# Patient Record
Sex: Male | Born: 1995 | Race: White | Hispanic: No | Marital: Single | Smoking: Never smoker
Health system: Southern US, Community
[De-identification: ages and names within clinical notes are randomized; demographics above are authoritative.]

## PROBLEM LIST (undated history)

## (undated) DIAGNOSIS — J45909 Unspecified asthma, uncomplicated: Secondary | ICD-10-CM

## (undated) DIAGNOSIS — F419 Anxiety disorder, unspecified: Secondary | ICD-10-CM

## (undated) HISTORY — PX: TYMPANOSTOMY TUBE PLACEMENT: SHX32

---
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
## 2016-09-21 ENCOUNTER — Emergency Department (HOSPITAL_BASED_OUTPATIENT_CLINIC_OR_DEPARTMENT_OTHER)
Admission: EM | Admit: 2016-09-21 | Discharge: 2016-09-21 | Disposition: A | Discharge status: Home / Self Care | Payer: BLUE CROSS/BLUE SHIELD | Attending: Emergency Medicine | Admitting: Emergency Medicine

## 2016-09-21 ENCOUNTER — Encounter (HOSPITAL_BASED_OUTPATIENT_CLINIC_OR_DEPARTMENT_OTHER): Payer: Self-pay | Admitting: *Deleted

## 2016-09-21 DIAGNOSIS — J45909 Unspecified asthma, uncomplicated: Secondary | ICD-10-CM | POA: Diagnosis not present

## 2016-09-21 DIAGNOSIS — R0989 Other specified symptoms and signs involving the circulatory and respiratory systems: Secondary | ICD-10-CM | POA: Diagnosis not present

## 2016-09-21 HISTORY — DX: Unspecified asthma, uncomplicated: J45.909

## 2016-09-21 NOTE — ED Triage Notes (Signed)
Pt Brought in by EMS from home , pt states took Vit c  Tab at 2200 and now has throat irritation

## 2016-09-21 NOTE — Discharge Instructions (Signed)
Return if you have difficulty breathing or are unable to swallow.

## 2016-09-21 NOTE — ED Provider Notes (Signed)
   [facility] Provider Note: Roberto DellJ. Lane Forrest Jaroszewski, MD, FACEP  CSN: [id number] MRN: [id number] ARRIVAL: 09/21/16 at 0041 ROOM: [facility]02/[facility]02   CHIEF COMPLAINT  Throat Irritation   HISTORY OF PRESENT ILLNESS  Roberto Clark is a 21 y.o. male who swallowed a vitamin C tablet yesterday evening about 10 PM. Since then he has had a foreign body sensation in his throat. He describes it as more of an irritation rather than frank pain. He has tried drinking water and honey without relief. The sensation is worse when he swallows. He is able to swallow. He is not having any difficulty breathing. He admits to history of anxiety and states that the symptoms are causing him to be anxious.   Past Medical History:  Diagnosis Date  . Asthma     Past Surgical History:  Procedure Laterality Date  . TYMPANOSTOMY TUBE PLACEMENT      History reviewed. No pertinent family history.  Social History  Substance Use Topics  . Smoking status: Never Smoker  . Smokeless tobacco: Never Used  . Alcohol use No    Prior to Admission medications   Not on File    Allergies Patient has no known allergies.   REVIEW OF SYSTEMS  Negative except as noted here or in the History of Present Illness.   PHYSICAL EXAMINATION  Initial Vital Signs Blood pressure (!) 174/93, pulse 98, temperature 98.9 F (37.2 C), temperature source Oral, resp. rate 19, height 5\' 6"  (1.676 m), weight 54 kg (119 lb), SpO2 100 %.  Examination General: Well-developed, well-nourished male in no acute distress; appearance consistent with age of record HENT: normocephalic; atraumatic; no foreign body or injury seen in oropharynx; uvula midline; no dysphonia; no stridor Eyes: pupils equal, round and reactive to light; extraocular muscles intact Neck: supple Heart: regular rate and rhythm Lungs: clear to auscultation bilaterally Abdomen: soft; nondistended; nontender; bowel sounds present Extremities: No deformity; full range of  motion; pulses normal Neurologic: Awake, alert and oriented; motor function intact in all extremities and symmetric; no facial droop Skin: Warm and dry Psychiatric: Anxious   RESULTS  Summary of this visit's results, reviewed by myself:   EKG Interpretation  Date/Time:    Ventricular Rate:    PR Interval:    QRS Duration:   QT Interval:    QTC Calculation:   R Axis:     Text Interpretation:        Laboratory Studies: No results found for this or any previous visit (from the past 24 hour(s)). Imaging Studies: No results found.  ED COURSE  Nursing notes and initial vitals signs, including pulse oximetry, reviewed.  Vitals:   09/21/16 0045 09/21/16 0048  BP:  (!) 174/93  Pulse:  98  Resp:  19  Temp:  98.9 F (37.2 C)  TempSrc:  Oral  SpO2:  100%  Weight: 54 kg (119 lb)   Height: 5\' 6"  (1.676 m)    As patient is able to swallow without difficulty I doubt the presence of a retained foreign body. Most likely he abraded his pharynx on swallowing the tablet. He was advised that Chloraseptic spray may help with his symptoms.  PROCEDURES    ED DIAGNOSES     ICD-10-CM   1. Foreign body sensation in throat R09.89        Paula LibraMolpus, Autumnrose Yore, MD 09/21/16 [phone]

## 2020-05-08 ENCOUNTER — Emergency Department
Admission: EM | Admit: 2020-05-08 | Discharge: 2020-05-08 | Disposition: A | Discharge status: Home / Self Care | Payer: BC Managed Care – PPO | Attending: Emergency Medicine | Admitting: Emergency Medicine

## 2020-05-08 DIAGNOSIS — R202 Paresthesia of skin: Secondary | ICD-10-CM | POA: Insufficient documentation

## 2020-05-08 DIAGNOSIS — J45909 Unspecified asthma, uncomplicated: Secondary | ICD-10-CM | POA: Diagnosis not present

## 2020-05-08 DIAGNOSIS — R0602 Shortness of breath: Secondary | ICD-10-CM

## 2020-05-08 DIAGNOSIS — F419 Anxiety disorder, unspecified: Secondary | ICD-10-CM | POA: Diagnosis not present

## 2020-05-08 MED ORDER — PROPRANOLOL HCL 20 MG PO TABS: 20.0000 mg | ORAL_TABLET | Freq: Three times a day (TID) | ORAL | 0 refills | Status: DC

## 2020-05-08 NOTE — ED Triage Notes (Signed)
Per ems sob, anxiety attack, heart rate 150's

## 2020-05-08 NOTE — ED Provider Notes (Signed)
[facility] Emergency Department Provider Note  ____________________________________________  Time seen: Approximately 3:49 PM  I have reviewed the triage vital signs and the nursing notes.   HISTORY  Chief Complaint Panic Attack (Per ems, panick attack, hr 150's, sob )    HPI Roberto Clark is a 25 y.o. male with a past history of asthma who comes the ED due to shortness of breath that started today after lunchtime.   He was in his usual state of health, ate lunch normally, and while walking back to work started getting more more short of breath.  This was associated with feeling of heart racing, bilateral hands tingling and feeling numb, bilateral legs feeling numb and tingly, face numbness.  No syncope.  No significant chest pain.  No preceding prodromal symptoms.  Symptoms are intermittent, currently resolved again.  Reports this is happened 3 times last week as well.  He has been feeling stressed and overwhelmed at work lately.  No recent travel trauma hospitalization or surgery.  No history of DVT or PE.  Also reports recently having recurrent left-sided nosebleed, does admit that he sometimes picks his nose due to discomfort.     Past Medical History:  Diagnosis Date  . Asthma      There are no problems to display for this patient.    Past Surgical History:  Procedure Laterality Date  . TYMPANOSTOMY TUBE PLACEMENT       Prior to Admission medications   Medication Sig Start Date End Date Taking? Authorizing Provider  propranolol (INDERAL) 20 MG tablet Take 1 tablet (20 mg total) by mouth 3 (three) times daily. 05/08/20  Yes Sharman Cheek, MD     Allergies Patient has no known allergies.   History reviewed. No pertinent family history.  Social History Social History   Tobacco Use  . Smoking status: Never Smoker  . Smokeless tobacco: Never Used  Substance Use Topics  . Alcohol use: No  . Drug use: No    Review of  Systems  Constitutional:   No fever or chills.  ENT:   No sore throat. No rhinorrhea. Cardiovascular:   No chest pain or syncope. Respiratory: Positive shortness of breath without cough. Gastrointestinal:   Negative for abdominal pain, vomiting and diarrhea.  Musculoskeletal:   Negative for focal pain or swelling All other systems reviewed and are negative except as documented above in ROS and HPI.  ____________________________________________   PHYSICAL EXAM:  VITAL SIGNS: ED Triage Vitals  Enc Vitals Group     BP 05/08/20 1457 (!) 153/89     Pulse Rate 05/08/20 1457 (!) 105     Resp 05/08/20 1457 (!) 22     Temp 05/08/20 1457 98.2 F (36.8 C)     Temp Source 05/08/20 1457 Oral     SpO2 05/08/20 1457 99 %     Weight 05/08/20 1458 154 lb 5.2 oz (70 kg)     Height 05/08/20 1458 5\' 8"  (1.727 m)     Head Circumference --      Peak Flow --      Pain Score 05/08/20 1458 0     Pain Loc --      Pain Edu? --      Excl. in GC? --     Vital signs reviewed, nursing assessments reviewed.   Constitutional:   Alert and oriented. Non-toxic appearance. Eyes:   Conjunctivae are normal. EOMI. PERRL. ENT      Head:   Normocephalic and  atraumatic.      Nose:   No epistaxis.  Small area of abrasion on the left anterior septum from recent bleeding, currently hemostatic.      Mouth/Throat: Normal.  Moist mucosa     Neck:   No meningismus. Full ROM. Hematological/Lymphatic/Immunilogical:   No cervical lymphadenopathy. Cardiovascular:   RRR. Symmetric bilateral radial and DP pulses.  No murmurs. Cap refill less than 2 seconds. Respiratory:   Normal respiratory effort without tachypnea/retractions. Breath sounds are clear and equal bilaterally. No wheezes/rales/rhonchi. Gastrointestinal:   Soft and nontender. Non distended. There is no CVA tenderness.  No rebound, rigidity, or guarding. Musculoskeletal:   Normal range of motion in all extremities. No joint effusions.  No lower extremity  tenderness.  No edema. Neurologic:   Normal speech and language.  Motor grossly intact. No acute focal neurologic deficits are appreciated.  Skin:    Skin is warm, dry and intact. No rash noted.  No petechiae, purpura, or bullae.  ____________________________________________    LABS (pertinent positives/negatives) (all labs ordered are listed, but only abnormal results are displayed) Labs Reviewed - No data to display ____________________________________________   EKG  EKG performed by EMS interpreted by me Normal sinus rhythm rate of 95, normal axis and intervals, normal QRS ST segments and T waves.  No evidence of underlying dysrhythmia or right heart strain.  ____________________________________________    RADIOLOGY  No results found.  ____________________________________________   PROCEDURES Procedures  ____________________________________________    CLINICAL IMPRESSION / ASSESSMENT AND PLAN / ED COURSE  Medications ordered in the ED: Medications - No data to display  Pertinent labs & imaging results that were available during my care of the patient were reviewed by me and considered in my medical decision making (see chart for details).  Roberto Clark was evaluated in Emergency Department on 05/08/2020 for the symptoms described in the history of present illness. He was evaluated in the context of the global COVID-19 pandemic, which necessitated consideration that the patient might be at risk for infection with the SARS-CoV-2 virus that causes COVID-19. Institutional protocols and algorithms that pertain to the evaluation of patients at risk for COVID-19 are in a state of rapid change based on information released by regulatory bodies including the CDC and federal and state organizations. These policies and algorithms were followed during the patient's care in the ED.   Patient presents with multiple intermittent episodes of shortness of breath and paresthesias  consistent with anxiety/panic attack.  Initial vital signs were somewhat tachycardic and tachypneic, but now symptoms have resolved and vital signs have normalized. Considering the patient's symptoms, medical history, and physical examination today, I have low suspicion for ACS, PE, TAD, pneumothorax, carditis, mediastinitis, pneumonia, CHF, or sepsis.  Doubt hyperthyroidism or other endocrine disturbance.  No evidence of underlying cardiac dysrhythmia.  I think PE is highly unlikely given normal EMS EKG and intermittent symptoms and normalized vital signs without any treatment.  We will do trial of propranolol, recommend obtaining primary care for follow-up.      ____________________________________________   FINAL CLINICAL IMPRESSION(S) / ED DIAGNOSES    Final diagnoses:  SOB (shortness of breath)  Anxiety     ED Discharge Orders         Ordered    propranolol (INDERAL) 20 MG tablet  3 times daily        05/08/20 1546          Portions of this note were generated with dragon dictation software. Dictation  errors may occur despite best attempts at proofreading.   Sharman Cheek, MD 05/08/20 [phone]

## 2020-05-15 ENCOUNTER — Ambulatory Visit
Admission: EM | Admit: 2020-05-15 | Discharge: 2020-05-15 | Disposition: A | Discharge status: Home / Self Care | Payer: BC Managed Care – PPO | Attending: Emergency Medicine | Admitting: Emergency Medicine

## 2020-05-15 ENCOUNTER — Other Ambulatory Visit: Payer: Self-pay

## 2020-05-15 ENCOUNTER — Encounter: Payer: Self-pay | Admitting: Emergency Medicine

## 2020-05-15 DIAGNOSIS — F419 Anxiety disorder, unspecified: Secondary | ICD-10-CM | POA: Diagnosis not present

## 2020-05-15 DIAGNOSIS — Z760 Encounter for issue of repeat prescription: Secondary | ICD-10-CM

## 2020-05-15 HISTORY — DX: Anxiety disorder, unspecified: F41.9

## 2020-05-15 MED ORDER — PROPRANOLOL HCL 20 MG PO TABS: 20.0000 mg | ORAL_TABLET | Freq: Three times a day (TID) | ORAL | 2 refills | Status: DC

## 2020-05-15 NOTE — Discharge Instructions (Addendum)
Continue to take your propranolol 3 times a day as originally prescribed for relief of your anxiety symptoms and panic attack.  Keep your appointment at the end of April to establish care with your primary care provider so that they may manage your medication going forward.  Ensure that you are keeping up with your oral fluid intake so that you do not become dehydrated to minimize your chance of becoming dizzy or passing out as a result of low blood pressure from taking this medication.

## 2020-05-15 NOTE — ED Provider Notes (Signed)
[facility]    CSN: [id number] Arrival date & time: 05/15/20  1101      History   Chief Complaint Chief Complaint  Patient presents with  . Medication Refill    HPI Roberto Clark is a 25 y.o. male.   HPI   25 year old male here for prescription refill of propranolol.  Patient was evaluated in the emergency department at [facility] on 05/08/2020 for shortness of breath and chest pain.  It was determined that this is most likely due to anxiety and panic attack as his evaluation was negative.  Patient was started on propranolol 20 mg 3 times a day which patient reports has been helping him.  Patient was only given a 7-day supply and no refills from the ER.  Patient has an appointment to establish care with a primary care provider but it is not until July 10, 2020.  Patient denies chest pain, dizziness, or syncope.  Past Medical History:  Diagnosis Date  . Anxiety   . Asthma     There are no problems to display for this patient.   Past Surgical History:  Procedure Laterality Date  . TYMPANOSTOMY TUBE PLACEMENT         Home Medications    Prior to Admission medications   Medication Sig Start Date End Date Taking? Authorizing Provider  propranolol (INDERAL) 20 MG tablet Take 1 tablet (20 mg total) by mouth 3 (three) times daily. 05/15/20 06/14/20  Becky Augusta, NP    Family History History reviewed. No pertinent family history.  Social History Social History   Tobacco Use  . Smoking status: Never Smoker  . Smokeless tobacco: Never Used  Vaping Use  . Vaping Use: Former  Substance Use Topics  . Alcohol use: No  . Drug use: No     Allergies   Patient has no known allergies.   Review of Systems Review of Systems  Cardiovascular: Negative for chest pain.  Neurological: Negative for dizziness and syncope.  Psychiatric/Behavioral: The patient is nervous/anxious.      Physical Exam Triage Vital Signs ED Triage Vitals  Enc Vitals Group      BP 05/15/20 1110 (!) 139/95     Pulse Rate 05/15/20 1110 (!) 55     Resp 05/15/20 1110 16     Temp 05/15/20 1110 97.7 F (36.5 C)     Temp Source 05/15/20 1110 Oral     SpO2 05/15/20 1110 100 %     Weight 05/15/20 1108 120 lb (54.4 kg)     Height 05/15/20 1108 5\' 3"  (1.6 m)     Head Circumference --      Peak Flow --      Pain Score 05/15/20 1108 0     Pain Loc --      Pain Edu? --      Excl. in GC? --    No data found.  Updated Vital Signs BP (!) 139/95 (BP Location: Left Arm)   Pulse (!) 55   Temp 97.7 F (36.5 C) (Oral)   Resp 16   Ht 5\' 3"  (1.6 m)   Wt 120 lb (54.4 kg)   SpO2 100%   BMI 21.26 kg/m   Visual Acuity Right Eye Distance:   Left Eye Distance:   Bilateral Distance:    Right Eye Near:   Left Eye Near:    Bilateral Near:     Physical Exam Vitals and nursing note reviewed.  Constitutional:  General: He is not in acute distress.    Appearance: Normal appearance. He is normal weight. He is not ill-appearing.  HENT:     Head: Normocephalic and atraumatic.  Cardiovascular:     Rate and Rhythm: Normal rate and regular rhythm.     Pulses: Normal pulses.     Heart sounds: Normal heart sounds. No murmur heard. No gallop.   Pulmonary:     Effort: Pulmonary effort is normal.     Breath sounds: Normal breath sounds. No wheezing, rhonchi or rales.  Skin:    General: Skin is warm and dry.     Capillary Refill: Capillary refill takes less than 2 seconds.     Findings: No erythema or rash.  Neurological:     General: No focal deficit present.     Mental Status: He is alert and oriented to person, place, and time.     Sensory: No sensory deficit.  Psychiatric:        Mood and Affect: Mood normal.        Behavior: Behavior normal.        Thought Content: Thought content normal.        Judgment: Judgment normal.      UC Treatments / Results  Labs (all labs ordered are listed, but only abnormal results are displayed) Labs Reviewed - No data  to display  EKG   Radiology No results found.  Procedures Procedures (including critical care time)  Medications Ordered in UC Medications - No data to display  Initial Impression / Assessment and Plan / UC Course  I have reviewed the triage vital signs and the nursing notes.  Pertinent labs & imaging results that were available during my care of the patient were reviewed by me and considered in my medical decision making (see chart for details).   Patient is a very pleasant 25 year old male here for a refill of his propranolol that he is taking for his anxiety and panic attacks.  Patient has been taking the medication as directed, 3 times a day, and reports that his symptoms have greatly improved.  Patient was only given a 7-day supply from the emergency department and does not have an appointment to see a primary care provider to establish care until the end of April.  Patient is tolerating the medication well and has not had any dizziness, chest pain, or syncope.  Physical exam is benign.  Will give patient a 30-day supply and 2 refill's to bridge him until his appointment.   Final Clinical Impressions(s) / UC Diagnoses   Final diagnoses:  Medication refill  Anxiety     Discharge Instructions     Continue to take your propranolol 3 times a day as originally prescribed for relief of your anxiety symptoms and panic attack.  Keep your appointment at the end of April to establish care with your primary care provider so that they may manage your medication going forward.  Ensure that you are keeping up with your oral fluid intake so that you do not become dehydrated to minimize your chance of becoming dizzy or passing out as a result of low blood pressure from taking this medication.    ED Prescriptions    Medication Sig Dispense Auth. Provider   propranolol (INDERAL) 20 MG tablet  (Status: Discontinued) Take 1 tablet (20 mg total) by mouth 3 (three) times daily. 90 tablet  Becky Augusta, NP   propranolol (INDERAL) 20 MG tablet Take 1 tablet (20 mg total) by  mouth 3 (three) times daily. 90 tablet Becky Augusta, NP     PDMP not reviewed this encounter.   Becky Augusta, NP 05/15/20 1125

## 2020-05-15 NOTE — ED Triage Notes (Signed)
Patient here to get a refill on his Propranolol for panic attack and anxiety.  Patient states that his symptoms have improved with the medicine. Patient states that his new visit appointment is not until April.

## 2020-07-10 DIAGNOSIS — Z8709 Personal history of other diseases of the respiratory system: Secondary | ICD-10-CM | POA: Insufficient documentation

## 2020-07-10 DIAGNOSIS — F32A Depression, unspecified: Secondary | ICD-10-CM | POA: Insufficient documentation

## 2020-07-10 DIAGNOSIS — F419 Anxiety disorder, unspecified: Secondary | ICD-10-CM | POA: Insufficient documentation

## 2021-07-07 DIAGNOSIS — R778 Other specified abnormalities of plasma proteins: Secondary | ICD-10-CM | POA: Diagnosis not present

## 2021-07-07 DIAGNOSIS — J45909 Unspecified asthma, uncomplicated: Secondary | ICD-10-CM | POA: Insufficient documentation

## 2021-07-07 DIAGNOSIS — R0789 Other chest pain: Principal | ICD-10-CM | POA: Insufficient documentation

## 2021-07-07 DIAGNOSIS — R0603 Acute respiratory distress: Secondary | ICD-10-CM | POA: Diagnosis not present

## 2021-07-07 DIAGNOSIS — R0602 Shortness of breath: Secondary | ICD-10-CM | POA: Diagnosis present

## 2021-07-08 ENCOUNTER — Observation Stay
Admit: 2021-07-08 | Discharge: 2021-07-08 | Disposition: A | Discharge status: Home / Self Care | Payer: No Typology Code available for payment source | Attending: Internal Medicine | Admitting: Internal Medicine

## 2021-07-08 ENCOUNTER — Emergency Department: Payer: No Typology Code available for payment source

## 2021-07-08 ENCOUNTER — Observation Stay
Admission: EM | Admit: 2021-07-08 | Discharge: 2021-07-09 | Disposition: A | Discharge status: Home / Self Care | Payer: No Typology Code available for payment source | Attending: Obstetrics and Gynecology | Admitting: Obstetrics and Gynecology

## 2021-07-08 ENCOUNTER — Encounter: Payer: Self-pay | Admitting: Emergency Medicine

## 2021-07-08 ENCOUNTER — Other Ambulatory Visit: Payer: Self-pay

## 2021-07-08 DIAGNOSIS — R778 Other specified abnormalities of plasma proteins: Secondary | ICD-10-CM

## 2021-07-08 DIAGNOSIS — R030 Elevated blood-pressure reading, without diagnosis of hypertension: Secondary | ICD-10-CM

## 2021-07-08 DIAGNOSIS — F411 Generalized anxiety disorder: Secondary | ICD-10-CM

## 2021-07-08 DIAGNOSIS — R079 Chest pain, unspecified: Secondary | ICD-10-CM | POA: Diagnosis not present

## 2021-07-08 DIAGNOSIS — F410 Panic disorder [episodic paroxysmal anxiety] without agoraphobia: Secondary | ICD-10-CM

## 2021-07-08 DIAGNOSIS — R7989 Other specified abnormal findings of blood chemistry: Secondary | ICD-10-CM

## 2021-07-08 DIAGNOSIS — R0603 Acute respiratory distress: Secondary | ICD-10-CM

## 2021-07-08 DIAGNOSIS — R0789 Other chest pain: Secondary | ICD-10-CM

## 2021-07-08 DIAGNOSIS — R200 Anesthesia of skin: Secondary | ICD-10-CM

## 2021-07-08 LAB — ECHOCARDIOGRAM COMPLETE
AR max vel: 1.8 cm2
AV Area VTI: 1.83 cm2
AV Area mean vel: 1.87 cm2
AV Mean grad: 3 mmHg
AV Peak grad: 6.9 mmHg
Ao pk vel: 1.31 m/s
Area-P 1/2: 4.99 cm2
Height: 63 in
MV VTI: 3.08 cm2
S' Lateral: 3 cm
Weight: 1920 oz

## 2021-07-08 LAB — CBC
HCT: 46.2 % (ref 39.0–52.0)
Hemoglobin: 15.4 g/dL (ref 13.0–17.0)
MCH: 27.8 pg (ref 26.0–34.0)
MCHC: 33.3 g/dL (ref 30.0–36.0)
MCV: 83.4 fL (ref 80.0–100.0)
Platelets: 348 10*3/uL (ref 150–400)
RBC: 5.54 MIL/uL (ref 4.22–5.81)
RDW: 12.6 % (ref 11.5–15.5)
WBC: 9.5 10*3/uL (ref 4.0–10.5)
nRBC: 0 % (ref 0.0–0.2)

## 2021-07-08 LAB — VITAMIN B12: Vitamin B-12: 286 pg/mL (ref 180–914)

## 2021-07-08 LAB — URINE DRUG SCREEN, QUALITATIVE (ARMC ONLY)
Amphetamines, Ur Screen: NOT DETECTED
Barbiturates, Ur Screen: NOT DETECTED
Benzodiazepine, Ur Scrn: NOT DETECTED
Cannabinoid 50 Ng, Ur ~~LOC~~: NOT DETECTED
Cocaine Metabolite,Ur ~~LOC~~: NOT DETECTED
MDMA (Ecstasy)Ur Screen: NOT DETECTED
Methadone Scn, Ur: NOT DETECTED
Opiate, Ur Screen: NOT DETECTED
Phencyclidine (PCP) Ur S: NOT DETECTED
Tricyclic, Ur Screen: NOT DETECTED

## 2021-07-08 LAB — BASIC METABOLIC PANEL
Anion gap: 10 (ref 5–15)
BUN: 12 mg/dL (ref 6–20)
CO2: 26 mmol/L (ref 22–32)
Calcium: 9.5 mg/dL (ref 8.9–10.3)
Chloride: 100 mmol/L (ref 98–111)
Creatinine, Ser: 0.96 mg/dL (ref 0.61–1.24)
GFR, Estimated: >60 mL/min (ref >60)
Glucose, Bld: 99 mg/dL (ref 70–99)
Potassium: 3.5 mmol/L (ref 3.5–5.1)
Sodium: 136 mmol/L (ref 135–145)

## 2021-07-08 LAB — TROPONIN I (HIGH SENSITIVITY)
Troponin I (High Sensitivity): 16 ng/L (ref <18)
Troponin I (High Sensitivity): 22 ng/L — ABNORMAL HIGH (ref <18)
Troponin I (High Sensitivity): 49 ng/L — ABNORMAL HIGH (ref <18)
Troponin I (High Sensitivity): 93 ng/L — ABNORMAL HIGH (ref <18)

## 2021-07-08 LAB — HIV ANTIBODY (ROUTINE TESTING W REFLEX): HIV Screen 4th Generation wRfx: NONREACTIVE

## 2021-07-08 LAB — FOLATE: Folate: 20.4 ng/mL (ref >5.9)

## 2021-07-08 MED ORDER — LACTATED RINGERS IV BOLUS
1000.0000 mL | Freq: Once | INTRAVENOUS | Status: AC
  Administered 2021-07-08: 1000 mL via INTRAVENOUS

## 2021-07-08 MED ORDER — IOHEXOL 350 MG/ML SOLN
75.0000 mL | Freq: Once | INTRAVENOUS | Status: AC | PRN
  Administered 2021-07-08: 75 mL via INTRAVENOUS

## 2021-07-08 MED ORDER — METOPROLOL TARTRATE 25 MG PO TABS
25.0000 mg | ORAL_TABLET | Freq: Two times a day (BID) | ORAL | Status: DC
  Administered 2021-07-08 – 2021-07-09 (×3): 25 mg via ORAL
  Filled 2021-07-08 (×3): qty 1

## 2021-07-08 MED ORDER — ENOXAPARIN SODIUM 40 MG/0.4ML IJ SOSY
40.0000 mg | PREFILLED_SYRINGE | INTRAMUSCULAR | Status: DC
  Administered 2021-07-08: 40 mg via SUBCUTANEOUS
  Filled 2021-07-08: qty 0.4

## 2021-07-08 MED ORDER — ACETAMINOPHEN 325 MG PO TABS: 650.0000 mg | ORAL_TABLET | ORAL | Status: DC | PRN

## 2021-07-08 MED ORDER — ASPIRIN EC 81 MG PO TBEC
81.0000 mg | DELAYED_RELEASE_TABLET | Freq: Every day | ORAL | Status: DC
  Administered 2021-07-08 – 2021-07-09 (×2): 81 mg via ORAL
  Filled 2021-07-08 (×2): qty 1

## 2021-07-08 MED ORDER — ONDANSETRON HCL 4 MG/2ML IJ SOLN: 4.0000 mg | Freq: Four times a day (QID) | INTRAMUSCULAR | Status: DC | PRN

## 2021-07-08 NOTE — Progress Notes (Signed)
Pt allowed father Ferne Reus) to get update # [phone]. Will notify incoming shift.  ?

## 2021-07-08 NOTE — ED Notes (Signed)
Dr. Para March contacted by this RN to ask about pt's NPO status and if pt. Can have diet entered. Will continue to monitor. ?

## 2021-07-08 NOTE — ED Notes (Signed)
Pt currently denies CP and denies SOB/difficulty breathing; pt in NAD.  ?

## 2021-07-08 NOTE — ED Provider Notes (Signed)
? ?[facility] ?Provider Note ? ? ? Event Date/Time  ? First MD Initiated Contact with Patient 07/08/21 0300   ?  (approximate) ? ? ?History  ? ?Shortness of Breath ? ? ?HPI ? ?Roberto Clark is a 26 y.o. male who reports history of anxiety as well as well-controlled asthma.  He presents tonight for multiple episodes of shortness of breath and chest pain/pressure.  This is happened in the past but it was much worse tonight and happened repeatedly.  He said that when he is in severe distress, sometimes his hands will curl up in the closets and he feels numbness and tingling in his arms and his legs.  The symptoms have resolved and he feels normal right now, just a little bit anxious.  He said that he has not been on any long trips and he has no history of heart or lung disease (other than the mild asthma).  This does not feel like an asthma exacerbation to him.  He also denies recent fever, nausea, vomiting, and abdominal pain.  He has not been using any drugs or alcohol.  No history of blood clots in the legs of the lungs. ?  ? ? ?Physical Exam  ? ?Triage Vital Signs: ?ED Triage Vitals  ?Enc Vitals Group  ?   BP 07/08/21 0010 (!) 148/89  ?   Pulse Rate 07/08/21 0010 (!) 113  ?   Resp 07/08/21 0010 (!) 22  ?   Temp 07/08/21 0010 98.1 ?F (36.7 ?C)  ?   Temp Source 07/08/21 0010 Oral  ?   SpO2 07/08/21 0010 98 %  ?   Weight 07/08/21 0011 54.4 kg (120 lb)  ?   Height 07/08/21 0011 1.6 m (5\' 3" )  ?   Head Circumference --   ?   Peak Flow --   ?   Pain Score 07/08/21 0011 0  ?   Pain Loc --   ?   Pain Edu? --   ?   Excl. in GC? --   ? ? ?Most recent vital signs: ?Vitals:  ? 07/08/21 0353 07/08/21 0355  ?BP: (!) 162/89   ?Pulse: (!) 107 (!) 103  ?Resp: 20 20  ?Temp: 98.4 ?F (36.9 ?C)   ?SpO2: 100% 97%  ? ? ? ?General: Awake, no distress.  ?CV:  Good peripheral perfusion.  Normal heart sounds. ?Resp:  Normal effort.  Lungs are clear to auscultation bilaterally with no wheezes, rales, nor  rhonchi. ?Abd:  No distention.  No tenderness to palpation.  Thin body habitus. ?Other:  No unilateral leg swelling.  Patient is anxious but not having a panic attack at the moment. ? ? ?ED Results / Procedures / Treatments  ? ?Labs ?(all labs ordered are listed, but only abnormal results are displayed) ?Labs Reviewed  ?TROPONIN I (HIGH SENSITIVITY) - Abnormal; Notable for the following components:  ?    Result Value  ? Troponin I (High Sensitivity) 22 (*)   ? All other components within normal limits  ?TROPONIN I (HIGH SENSITIVITY) - Abnormal; Notable for the following components:  ? Troponin I (High Sensitivity) 93 (*)   ? All other components within normal limits  ?CBC  ?BASIC METABOLIC PANEL  ?URINE DRUG SCREEN, QUALITATIVE ([facility] ONLY)  ? ? ? ?EKG ? ?ED ECG REPORT ?I04/29/23, the attending physician, personally viewed and interpreted this ECG. ? ?Date: 07/08/2021 ?EKG Time: 00: 07 ?Rate: 114 ?Rhythm: Sinus tachycardia ?QRS Axis: normal ?Intervals: normal ?  ST/T Wave abnormalities: Non-specific ST segment / T-wave changes, but no clear evidence of acute ischemia. ?Narrative Interpretation: no definitive evidence of acute ischemia; does not meet STEMI criteria. ? ? ? ?RADIOLOGY ?No evidence of pulmonary embolism on CTA.  See below for details. ? ? ? ?PROCEDURES: ? ?Critical Care performed: No ? ?.1-3 Lead EKG Interpretation ?Performed by: Loleta RoseForbach, Matie Dimaano, MD ?Authorized by: Loleta RoseForbach, Johni Narine, MD  ? ?  Interpretation: abnormal   ?  ECG rate:  108 ?  ECG rate assessment: tachycardic   ?  Rhythm: sinus tachycardia   ?  Ectopy: none   ?  Conduction: normal   ? ? ?MEDICATIONS ORDERED IN ED: ?Medications  ?lactated ringers bolus 1,000 mL (0 mLs Intravenous Stopped 07/08/21 0513)  ?iohexol (OMNIPAQUE) 350 MG/ML injection 75 mL (75 mLs Intravenous Contrast Given 07/08/21 0406)  ? ? ? ?IMPRESSION / MDM / ASSESSMENT AND PLAN / ED COURSE  ?I reviewed the triage vital signs and the nursing notes. ?             ?                ? ?Differential diagnosis includes, but is not limited to, anxiety/panic attacks, PE, ACS, pneumonia. ? ?The patient is low risk for ACS.  He is tachycardic and tachypneic.  He has no hypoxemia but he is having chest pain or shortness of breath.  No known risk factors for thromboembolism. ? ?His EKG is notable for tachycardia but does not show any evidence of ischemia.  I personally reviewed his two-view chest x-ray and I see no evidence of acute infection or other acute abnormality to explain his symptoms. ? ?Labs ordered initially include BMP, CBC, high-sensitivity troponin.  His CBC and basic metabolic panel are both within normal limits.  His high-sensitivity troponin, however, is very slightly elevated at 22. ? ?The patient is on the cardiac monitor to evaluate for evidence of arrhythmia and/or significant heart rate changes. ? ?At this point my working diagnosis is a panic attack that was significant enough to cause a high-sensitivity troponin increase above the upper limit of normal.  However he is calm and feeling well at this time, just a little bit anxious at baseline.  I explained that his results are mostly reassuring but given the symptoms he is describing, I believe it is reasonable to order a CTA chest to rule out pulmonary embolism.  We are also repeating a troponin to make sure it is staying the same or downtrending.  The patient agrees with the plan and if his results are reassuring with no evidence of an acute or emergent medical condition, he agrees with the plan for discharge and outpatient follow-up.  However he may require admission for cardiac observation or additional evaluation and treatment. ? ?Clinical Course as of 07/08/21 0516  ?Thu Jul 08, 2021  ?0435 Troponin I (High Sensitivity)(!): 93 ?Troponin has gone up from 22 to 93. [CF]  ?60510 I personally reviewed the patient's CTA chest and I see no evidence of pulmonary embolism.  Radiologist agrees that there is no PE and no other acute  abnormality. ? ?However, given the constellation of symptoms including episodic chest pain and shortness of breath with initial tachycardia and tachypnea, now with a troponin that is rising first from 22 to greater than 90, I feel that it is necessary per protocol and to help avoid the possibility of acute decompensation and that the patient be admitted to the hospitalist service.  I  am consulting the hospitalist for admission.  Also, while I think it is unlikely he is using cocaine (and he denies it), since this is a possible reason he could be experiencing some demand ischemia, I ordered a urine drug screen. [CF]  ?3818 Consulted with Dr. Para March with the hospitalist service.  We discussed case by [facility] secure chat message and she will admit the patient. [CF]  ?  ?Clinical Course User Index ?[CF] Loleta Rose, MD  ? ? ? ?FINAL CLINICAL IMPRESSION(S) / ED DIAGNOSES  ? ?Final diagnoses:  ?Acute respiratory distress  ?Atypical chest pain  ?Elevated troponin level  ? ? ? ?Rx / DC Orders  ? ?ED Discharge Orders   ? ? None  ? ?  ? ? ? ?Note:  This document was prepared using Dragon voice recognition software and may include unintentional dictation errors. ?  ?Loleta Rose, MD ?07/08/21 [phone] ? ?

## 2021-07-08 NOTE — ED Notes (Signed)
Pt denies pain currently. Echo being completed currently.  ?

## 2021-07-08 NOTE — ED Notes (Signed)
Pt calm and in NAD; pt denies any needs currently.  ?

## 2021-07-08 NOTE — ED Notes (Signed)
Pt denies any needs currently. Pt told to call his sister at her request when he gets the chance.  ?

## 2021-07-08 NOTE — ED Notes (Signed)
Pt resting quietly. No acute distress

## 2021-07-08 NOTE — ED Notes (Signed)
Transport Requested  ?

## 2021-07-08 NOTE — ED Triage Notes (Signed)
Pt to ED from home BIB roommate, states SOB that started tonight.  Denies pain or cough.  States has had something similar in the past and told anxiety.  Pt denies drugs or alcohol tonight.  Pt states feels hot, skin diaphoretic, and speaking fast.  ?

## 2021-07-08 NOTE — ED Notes (Signed)
MD at bedside. 

## 2021-07-08 NOTE — H&P (Signed)
?History and Physical  ? ? ?Patient: Roberto Clark [id number] DOB: [birth year] ?DOA: 07/08/2021 ?DOS: the patient was seen and examined on 07/08/2021 ?PCP: [facility]  ?Patient coming from: Home ? ?Chief Complaint:  ?Chief Complaint  ?Patient presents with  ? Shortness of Breath  ? ?HPI: Roberto Clark is a 26 y.o. male with medical history significant for anxiety and asthma ?Who presents to the ER via private vehicle for evaluation follow-up on the multiple symptoms which include numbness in both legs with radiation to his upper arms.  He states that he has episodes of numbness but not as severe as the one he had yesterday.  He was unable to ambulate and had to crawl back to his bed on his knees.  He has intermittent low back pain but denies having any urinary or fecal incontinence.  He denies having any saddle anesthesia. ?He also complains of chest tightness associated with shortness of breath and when he arrived to ER he was noted to be diaphoretic.  He denies having any nausea, no vomiting or palpitation.  He denies nicotine use but has a history of vaping and stopped about a year ago.  He has a family history of coronary artery disease.  No history of diabetes mellitus or hypertension. ?He has no abdominal pain, no changes in his bowel habits, no fever, no chills, no dizziness, no lightheadedness, no headache, no blurred vision or focal deficit. ?  ? ?Review of Systems: As mentioned in the history of present illness. All other systems reviewed and are negative. ?Past Medical History:  ?Diagnosis Date  ? Anxiety   ? Asthma   ? ?Past Surgical History:  ?Procedure Laterality Date  ? TYMPANOSTOMY TUBE PLACEMENT    ? ?Social History:  reports that he has never smoked. He has never used smokeless tobacco. He reports that he does not drink alcohol and does not use drugs. ? ?No Known Allergies ? ?History reviewed. No pertinent family history. ? ?Prior to Admission medications   ?Not on File  ? ? ?Physical  Exam: ?Vitals:  ? 07/08/21 0700 07/08/21 0715 07/08/21 0730 07/08/21 0745  ?BP: 137/83     ?Pulse: 82 82  73  ?Resp: (!) 25 (!) 31 (!) 36 (!) 24  ?Temp:      ?TempSrc:      ?SpO2: 100% 98% 100% 100%  ?Weight:      ?Height:      ? ?Physical Exam ?Vitals and nursing note reviewed.  ?Constitutional:   ?   Appearance: He is well-developed.  ?HENT:  ?   Head: Normocephalic and atraumatic.  ?   Mouth/Throat:  ?   Mouth: Mucous membranes are moist.  ?Eyes:  ?   Pupils: Pupils are equal, round, and reactive to light.  ?Cardiovascular:  ?   Rate and Rhythm: Normal rate and regular rhythm.  ?Pulmonary:  ?   Effort: Pulmonary effort is normal.  ?   Breath sounds: Normal breath sounds.  ?Abdominal:  ?   General: Bowel sounds are normal.  ?   Palpations: Abdomen is soft.  ?Musculoskeletal:     ?   General: Normal range of motion.  ?   Cervical back: Normal range of motion and neck supple.  ?Skin: ?   General: Skin is warm and dry.  ?Neurological:  ?   General: No focal deficit present.  ?   Mental Status: He is alert.  ?Psychiatric:     ?   Mood and Affect:  Mood normal.     ?   Behavior: Behavior normal.  ? ? ?Data Reviewed: ?Relevant notes from primary care and specialist visits, past discharge summaries as available in EHR, including Care Everywhere. ?Prior diagnostic testing as pertinent to current admission diagnoses ?Updated medications and problem lists for reconciliation ?ED course, including vitals, labs, imaging, treatment and response to treatment ?Triage notes, nursing and pharmacy notes and ED provider's notes ?Notable results as noted in HPI ?Labs reviewed.  Urine drug screen is negative.  Troponin 22 >> 93, CBC and BMP are within normal limits ?Chest x-ray reviewed by me shows no acute findings ?CT angiogram of the chest shows lower lobe respiratory motion artifact. No acute pulmonary ?embolus identified. Otherwise negative CT appearance of the Chest. ?Twelve-lead EKG reviewed by me shows sinus tachycardia with  biatrial enlargement. ?There are no new results to review at this time. ? ?Assessment and Plan: ?Chest pain ?Patient with a past medical history significant for asthma and anxiety who presents for evaluation of chest tightness and shortness of breath at rest.  Noted to be diaphoretic upon presentation ?He has bumped his troponin from 20 >> 93 ?EKG shows sinus tachycardia ?We will cycle cardiac enzymes ?Obtain 2D echocardiogram to assess LVEF and rule out regional wall motion abnormality ?Place patient on aspirin 81 mg daily ?Consult cardiology ? ? ? ? ? Advance Care Planning:   Code Status: Full Code  ? ?Consults: Cardiology ? ?Family Communication: Greater than 50% of time was spent discussing patient's condition and plan of care with him at the bedside.  All questions and concerns have been addressed.  He verbalizes understanding and agrees with the plan. ? ?Severity of Illness: ?The appropriate patient status for this patient is OBSERVATION. Observation status is judged to be reasonable and necessary in order to provide the required intensity of service to ensure the patient's safety. The patient's presenting symptoms, physical exam findings, and initial radiographic and laboratory data in the context of their medical condition is felt to place them at decreased risk for further clinical deterioration. Furthermore, it is anticipated that the patient will be medically stable for discharge from the hospital within 2 midnights of admission.  ? ?Author: ?Collier Bullock, MD ?07/08/2021 8:58 AM ? ?For on call review [email or web address].  ?

## 2021-07-08 NOTE — Consult Note (Signed)
Roberto Clark is a 26 y.o. male  [id number] ? ?Primary Cardiologist: Adrian BlackwaterShaukat Khan, MD ?Reason for Consultation: elevated troponins ? ?HPI: Roberto Kellaron D Morel is a 26 y.o. male with medical history significant for anxiety and asthma. Patient complained of chest tightness associated with shortness of breath and when he arrived to ER. He was noted to be diaphoretic.  He denies having any nausea, no vomiting or palpitations.  He denies nicotine use but has a history of vaping and stopped about a year ago.  He has a family history of coronary artery disease.  No history of diabetes mellitus or hypertension. ?He has no abdominal pain, no changes in his bowel habits, no fever, no chills, no dizziness, no lightheadedness, no headache, no blurred vision or focal deficit. ? ?Review of Systems: denies current chest pain, shortness of breath ? ? ?Past Medical History:  ?Diagnosis Date  ? Anxiety   ? Asthma   ? ? ?(Not in a hospital admission) ? ? ? ? aspirin EC  81 mg Oral Daily  ? enoxaparin (LOVENOX) injection  40 mg Subcutaneous Q24H  ? metoprolol tartrate  25 mg Oral BID  ? ? ?Infusions: ? ? ?No Known Allergies ? ?Social History  ? ?Socioeconomic History  ? Marital status: Single  ?  Spouse name: Not on file  ? Number of children: Not on file  ? Years of education: Not on file  ? Highest education level: Not on file  ?Occupational History  ? Not on file  ?Tobacco Use  ? Smoking status: Never  ? Smokeless tobacco: Never  ?Vaping Use  ? Vaping Use: Former  ?Substance and Sexual Activity  ? Alcohol use: No  ? Drug use: No  ? Sexual activity: Never  ?Other Topics Concern  ? Not on file  ?Social History Narrative  ? Not on file  ? ?Social Determinants of Health  ? ?Financial Resource Strain: Not on file  ?Food Insecurity: Not on file  ?Transportation Needs: Not on file  ?Physical Activity: Not on file  ?Stress: Not on file  ?Social Connections: Not on file  ?Intimate Partner Violence: Not on file  ? ? ?History reviewed. No pertinent  family history. ? ?PHYSICAL EXAM: ?Vitals:  ? 07/08/21 0730 07/08/21 0745  ?BP:    ?Pulse:  73  ?Resp: (!) 36 (!) 24  ?Temp:    ?SpO2: 100% 100%  ? ? ? ?Intake/Output Summary (Last 24 hours) at 07/08/2021 0902 ?Last data filed at 07/08/2021 0700 ?Gross per 24 hour  ?Intake 1000 ml  ?Output 500 ml  ?Net 500 ml  ? ? ?General:  Well appearing. No respiratory difficulty ?HEENT: normal ?Neck: supple. no JVD. Carotids 2+ bilat; no bruits. No lymphadenopathy or thryomegaly appreciated. ?Cor: PMI nondisplaced. Regular rate & rhythm. No rubs, gallops or murmurs. ?Lungs: clear ?Abdomen: soft, nontender, nondistended. No hepatosplenomegaly. No bruits or masses. Good bowel sounds. ?Extremities: no cyanosis, clubbing, rash, edema ?Neuro: alert & oriented x 3, cranial nerves grossly intact. moves all 4 extremities w/o difficulty. Affect pleasant. ? ?ECG: sinus tachycardia, HR 114 bpm, RAE ? ?Results for orders placed or performed during the hospital encounter of 07/08/21 (from the past 24 hour(s))  ?CBC     Status: None  ? Collection Time: 07/08/21 12:14 AM  ?Result Value Ref Range  ? WBC 9.5 4.0 - 10.5 K/uL  ? RBC 5.54 4.22 - 5.81 MIL/uL  ? Hemoglobin 15.4 13.0 - 17.0 g/dL  ? HCT 46.2 39.0 - 52.0 %  ?  MCV 83.4 80.0 - 100.0 fL  ? MCH 27.8 26.0 - 34.0 pg  ? MCHC 33.3 30.0 - 36.0 g/dL  ? RDW 12.6 11.5 - 15.5 %  ? Platelets 348 150 - 400 K/uL  ? nRBC 0.0 0.0 - 0.2 %  ?Basic metabolic panel     Status: None  ? Collection Time: 07/08/21 12:14 AM  ?Result Value Ref Range  ? Sodium 136 135 - 145 mmol/L  ? Potassium 3.5 3.5 - 5.1 mmol/L  ? Chloride 100 98 - 111 mmol/L  ? CO2 26 22 - 32 mmol/L  ? Glucose, Bld 99 70 - 99 mg/dL  ? BUN 12 6 - 20 mg/dL  ? Creatinine, Ser 0.96 0.61 - 1.24 mg/dL  ? Calcium 9.5 8.9 - 10.3 mg/dL  ? GFR, Estimated >60 >60 mL/min  ? Anion gap 10 5 - 15  ?Troponin I (High Sensitivity)     Status: Abnormal  ? Collection Time: 07/08/21 12:14 AM  ?Result Value Ref Range  ? Troponin I (High Sensitivity) 22 (H) <18 ng/L   ?Troponin I (High Sensitivity)     Status: Abnormal  ? Collection Time: 07/08/21  3:49 AM  ?Result Value Ref Range  ? Troponin I (High Sensitivity) 93 (H) <18 ng/L  ?Urine Drug Screen, Qualitative ([facility] only)     Status: None  ? Collection Time: 07/08/21  5:18 AM  ?Result Value Ref Range  ? Tricyclic, Ur Screen NONE DETECTED NONE DETECTED  ? Amphetamines, Ur Screen NONE DETECTED NONE DETECTED  ? MDMA (Ecstasy)Ur Screen NONE DETECTED NONE DETECTED  ? Cocaine Metabolite,Ur [place] NONE DETECTED NONE DETECTED  ? Opiate, Ur Screen NONE DETECTED NONE DETECTED  ? Phencyclidine (PCP) Ur S NONE DETECTED NONE DETECTED  ? Cannabinoid 50 Ng, Ur [place] NONE DETECTED NONE DETECTED  ? Barbiturates, Ur Screen NONE DETECTED NONE DETECTED  ? Benzodiazepine, Ur Scrn NONE DETECTED NONE DETECTED  ? Methadone Scn, Ur NONE DETECTED NONE DETECTED  ? ?DG Chest 2 View ? ?Result Date: 07/08/2021 ?CLINICAL DATA:  Shortness of breath EXAM: CHEST - 2 VIEW COMPARISON:  None. FINDINGS: The heart size and mediastinal contours are within normal limits. Both lungs are clear. The visualized skeletal structures are unremarkable. IMPRESSION: No acute findings. Electronically Signed   By: Charlett Nose M.D.   On: 07/08/2021 00:35  ? ?CT Angio Chest PE W/Cm &/Or Wo Cm ? ?Result Date: 07/08/2021 ?CLINICAL DATA:  26 year old male with shortness of breath onset tonight. Diaphoretic. EXAM: CT ANGIOGRAPHY CHEST WITH CONTRAST TECHNIQUE: Multidetector CT imaging of the chest was performed using the standard protocol during bolus administration of intravenous contrast. Multiplanar CT image reconstructions and MIPs were obtained to evaluate the vascular anatomy. RADIATION DOSE REDUCTION: This exam was performed according to the departmental dose-optimization program which includes automated exposure control, adjustment of the mA and/or kV according to patient size and/or use of iterative reconstruction technique. CONTRAST:  10mL OMNIPAQUE IOHEXOL 350 MG/ML SOLN COMPARISON:   Chest radiographs 0020 hours today. FINDINGS: Cardiovascular: Good contrast bolus timing in the pulmonary arterial tree. Respiratory motion in the lower lobes. No focal filling defect identified in the pulmonary arteries to suggest acute pulmonary embolism. No cardiomegaly or pericardial effusion. Normal visible aorta. No calcified coronary artery atherosclerosis is evident. Mediastinum/Nodes: Negative. Lungs/Pleura: Major airways are patent. Relatively normal lung volumes. Respiratory motion in both lower lobes and at the posterior costophrenic angles, but otherwise both lungs appear clear. No pleural effusion. Upper Abdomen: Negative visible liver, spleen, pancreas, adrenal glands,  left kidney, and bowel in the upper abdomen. Musculoskeletal: Negative. Review of the MIP images confirms the above findings. IMPRESSION: 1. Lower lobe respiratory motion artifact. No acute pulmonary embolus identified. 2. Otherwise negative CT appearance of the Chest. Electronically Signed   By: Odessa Fleming M.D.   On: 07/08/2021 04:32   ? ? ?ASSESSMENT AND PLAN: Patient resting comfortably in bed, currently symptom free. Repeat troponin levels pending. Patient can be discharged home with follow up in office tomorrow, 07/09/21 at 11:15 am. ? ?Museum/gallery conservator FNP-C ?

## 2021-07-08 NOTE — Assessment & Plan Note (Signed)
Patient with a past medical history significant for asthma and anxiety who presents for evaluation of chest tightness and shortness of breath at rest.  Noted to be diaphoretic upon presentation ?He has bumped his troponin from 20 >> 93 ?EKG shows sinus tachycardia ?We will cycle cardiac enzymes ?Obtain 2D echocardiogram to assess LVEF and rule out regional wall motion abnormality ?Place patient on aspirin 81 mg daily ?Consult cardiology ?

## 2021-07-08 NOTE — Progress Notes (Signed)
Admission profile updated. ?

## 2021-07-08 NOTE — ED Notes (Addendum)
Troponin=93 c/o lab ; ERMD made aware ?

## 2021-07-08 NOTE — ED Notes (Signed)
[place] , pt's roommate/ ride for home ?

## 2021-07-08 NOTE — Plan of Care (Signed)
?  Problem: Education: ?Goal: Knowledge of General Education information will improve ?Description: Including pain rating scale, medication(s)/side effects and non-pharmacologic comfort measures ?Outcome: Progressing ?  ?Problem: Clinical Measurements: ?Goal: Respiratory complications will improve ?Outcome: Progressing ?  ?Problem: Clinical Measurements: ?Goal: Cardiovascular complication will be avoided ?Outcome: Progressing ?  ?Problem: Activity: ?Goal: Risk for activity intolerance will decrease ?Outcome: Progressing ?  ?Problem: Coping: ?Goal: Level of anxiety will decrease ?Outcome: Progressing ?  ?Problem: Pain Managment: ?Goal: General experience of comfort will improve ?Outcome: Progressing ?  ?Problem: Safety: ?Goal: Ability to remain free from injury will improve ?Outcome: Progressing ?  ?

## 2021-07-08 NOTE — ED Notes (Signed)
Pt eating breakfast 

## 2021-07-08 NOTE — ED Notes (Signed)
Pt notified his father called secretary wanting to speak with him. Pt still has hospital phone. Pt's girlfriend remains with him.  ?

## 2021-07-08 NOTE — ED Notes (Signed)
Pt given hospital phone to contact family  

## 2021-07-08 NOTE — Progress Notes (Signed)
*  PRELIMINARY RESULTS* ?Echocardiogram ?2D Echocardiogram has been performed. ? ?Roberto Clark, Dorene Sorrow ?07/08/2021, 1:44 PM ?

## 2021-07-08 NOTE — ED Notes (Signed)
Pt's lunch tray to bedside. Pt denies CP and SOB. Pt calm.  ?

## 2021-07-09 ENCOUNTER — Observation Stay: Payer: No Typology Code available for payment source

## 2021-07-09 DIAGNOSIS — F411 Generalized anxiety disorder: Secondary | ICD-10-CM

## 2021-07-09 DIAGNOSIS — R7989 Other specified abnormal findings of blood chemistry: Secondary | ICD-10-CM

## 2021-07-09 DIAGNOSIS — R030 Elevated blood-pressure reading, without diagnosis of hypertension: Secondary | ICD-10-CM

## 2021-07-09 DIAGNOSIS — R778 Other specified abnormalities of plasma proteins: Secondary | ICD-10-CM

## 2021-07-09 DIAGNOSIS — F410 Panic disorder [episodic paroxysmal anxiety] without agoraphobia: Secondary | ICD-10-CM

## 2021-07-09 DIAGNOSIS — R079 Chest pain, unspecified: Secondary | ICD-10-CM | POA: Diagnosis not present

## 2021-07-09 DIAGNOSIS — R200 Anesthesia of skin: Secondary | ICD-10-CM

## 2021-07-09 LAB — LIPID PANEL
Cholesterol: 192 mg/dL (ref 0–200)
HDL: 34 mg/dL — ABNORMAL LOW (ref >40)
LDL Cholesterol: 137 mg/dL — ABNORMAL HIGH (ref 0–99)
Total CHOL/HDL Ratio: 5.6 RATIO
Triglycerides: 105 mg/dL (ref <150)
VLDL: 21 mg/dL (ref 0–40)

## 2021-07-09 LAB — RPR: RPR Ser Ql: NONREACTIVE

## 2021-07-09 MED ORDER — SERTRALINE HCL 50 MG PO TABS: 50.0000 mg | ORAL_TABLET | Freq: Every day | ORAL | 2 refills | Status: DC

## 2021-07-09 NOTE — Progress Notes (Signed)
SUBJECTIVE: Roberto Clark is a 26 y.o. male with medical history significant for anxiety and asthma. Patient complained of chest tightness associated with shortness of breath and when he arrived to ER. He was noted to be diaphoretic.  He denies having any nausea, no vomiting or palpitations.  He denies nicotine use but has a history of vaping and stopped about a year ago.  He has a family history of coronary artery disease.  No history of diabetes mellitus or hypertension. ?He has no abdominal pain, no changes in his bowel habits, no fever, no chills, no dizziness, no lightheadedness, no headache, no blurred vision or focal deficit. ? ?Reports no further chest pain.  ? ? ?Vitals:  ? 07/08/21 2128 07/09/21 0040 07/09/21 0350 07/09/21 0810  ?BP: (!) 146/89 133/89 131/65 (!) 139/96  ?Pulse:   78 66  ?Resp:   20 16  ?Temp:  98 ?F (36.7 ?C) 98 ?F (36.7 ?C) 98.4 ?F (36.9 ?C)  ?TempSrc:  Oral Oral   ?SpO2:  100% 98% 100%  ?Weight:      ?Height:      ? ? ?Intake/Output Summary (Last 24 hours) at 07/09/2021 0900 ?Last data filed at 07/09/2021 0813 ?Gross per 24 hour  ?Intake 1432 ml  ?Output 450 ml  ?Net 982 ml  ? ? ?LABS: ?Basic Metabolic Panel: ?Recent Labs  ?  07/08/21 ?0014  ?NA 136  ?K 3.5  ?CL 100  ?CO2 26  ?GLUCOSE 99  ?BUN 12  ?CREATININE 0.96  ?CALCIUM 9.5  ? ?Liver Function Tests: ?No results for input(s): AST, ALT, ALKPHOS, BILITOT, PROT, ALBUMIN in the last 72 hours. ?No results for input(s): LIPASE, AMYLASE in the last 72 hours. ?CBC: ?Recent Labs  ?  07/08/21 ?0014  ?WBC 9.5  ?HGB 15.4  ?HCT 46.2  ?MCV 83.4  ?PLT 348  ? ?Cardiac Enzymes: ?No results for input(s): CKTOTAL, CKMB, CKMBINDEX, TROPONINI in the last 72 hours. ?BNP: ?Invalid input(s): POCBNP ?D-Dimer: ?No results for input(s): DDIMER in the last 72 hours. ?Hemoglobin A1C: ?No results for input(s): HGBA1C in the last 72 hours. ?Fasting Lipid Panel: ?Recent Labs  ?  07/09/21 ?0638  ?CHOL 192  ?HDL 34*  ?LDLCALC 137*  ?TRIG 105  ?CHOLHDL 5.6  ? ?Thyroid  Function Tests: ?No results for input(s): TSH, T4TOTAL, T3FREE, THYROIDAB in the last 72 hours. ? ?Invalid input(s): FREET3 ?Anemia Panel: ?Recent Labs  ?  07/08/21 ?1131  ?VITAMINB12 286  ?FOLATE 20.4  ? ? ? ?PHYSICAL EXAM ?General: Well developed, well nourished, in no acute distress ?HEENT:  Normocephalic and atramatic ?Neck:  No JVD.  ?Lungs: Clear bilaterally to auscultation and percussion. ?Heart: HRRR . Normal S1 and S2 without gallops or murmurs.  ?Abdomen: Bowel sounds are positive, abdomen soft and non-tender  ?Msk:  Back normal, normal gait. Normal strength and tone for age. ?Extremities: No clubbing, cyanosis or edema.   ?Neuro: Alert and oriented X 3. ?Psych:  Good affect, responds appropriately ? ?TELEMETRY: NSR, HR 80 bpm ? ?ASSESSMENT AND PLAN: Patient currently resting comfortably in bed. Mother at bed side. Denies any further chest pain. Troponin levels trending down. Patient can be discharged from a cardiac standpoint with follow up in office on Monday, 07/12/21 at 10:00 am. ? ?Active Problems: ?  Chest pain ?  ? ?Google, FNP-C ?07/09/2021 ?9:00 AM ? ? ? ?    ?

## 2021-07-09 NOTE — Discharge Summary (Signed)
Roberto Clark RXV:[id number] DOB: December 03, 1995 DOA: 07/08/2021 ? ?PCP: [facility] ? ?Admit date: 07/08/2021 ?Discharge date: 07/09/2021 ? ?Time spent: 45 minutes ? ?Recommendations for Outpatient Follow-up:  ?Cardiology f/u ?Pcp f/u ?  ? ? ? ?Discharge Diagnoses:  ?Active Problems: ?  Chest pain ?  Panic disorder ?  GAD (generalized anxiety disorder) ?  Elevated troponin ?  Numbness ?  Elevated blood pressure reading ? ? ?Discharge Condition: good ? ?Diet recommendation: regular ? ?Filed Weights  ? 07/08/21 0011 07/08/21 1936  ?Weight: 54.4 kg 57.3 kg  ? ? ?History of present illness:  ?From admission h and p ?Roberto Clark is a 26 y.o. male with medical history significant for anxiety and asthma ?Who presents to the ER via private vehicle for evaluation follow-up on the multiple symptoms which include numbness in both legs with radiation to his upper arms.  He states that he has episodes of numbness but not as severe as the one he had yesterday.  He was unable to ambulate and had to crawl back to his bed on his knees.  He has intermittent low back pain but denies having any urinary or fecal incontinence.  He denies having any saddle anesthesia. ?He also complains of chest tightness associated with shortness of breath and when he arrived to ER he was noted to be diaphoretic.  He denies having any nausea, no vomiting or palpitation.  He denies nicotine use but has a history of vaping and stopped about a year ago.  He has a family history of coronary artery disease.  No history of diabetes mellitus or hypertension. ?He has no abdominal pain, no changes in his bowel habits, no fever, no chills, no dizziness, no lightheadedness, no headache, no blurred vision or focal deficit. ? ?Hospital Course:  ?Patient admitted with chest pain which resolved spontanously. Troponin found to be elevated, peaked at 93. Cardiology consulted. TTE performed, normal. Suspicion this is demand from panic. Cardiology advised outpatient f/u,  scheduled for 5/1. Patient has no cardiac history. Patient also endorses intermittent numbness in hands and feet and face associated with anxiety. This appears to be panic disorder. Mother very concerned about central process - CT obtained which was normal. Hiv, previous hcv, electrolytes, b12, cbc all normal. Normal complete neuro exam. Advised can f/u with neurology as outpatient for further evaluation of this intermittent numbness but encouraged to focus on mental health as more likely manifestation of anxiety. Interested in starting med for such so starting sertraline. Advised close pcp f/u. Did have intermittent mildly elevated bp, advise attention to that at pcp f/u ? ?Procedures: ?none  ? ?Consultations: ?cardiology ? ?Discharge Exam: ?Vitals:  ? 07/09/21 0350 07/09/21 0810  ?BP: 131/65 (!) 139/96  ?Pulse: 78 66  ?Resp: 20 16  ?Temp: 98 ?F (36.7 ?C) 98.4 ?F (36.9 ?C)  ?SpO2: 98% 100%  ? ? ?General: NAD ?Cardiovascular: RRR no murmur ?Respiratory: ctab ?Neuro: cn2-12 grossly intact, 5/5 upper and lower strength, normal gait, normal finger to nose, distal sensation to light touch intact b/l ? ?Discharge Instructions ? ? ?Discharge Instructions   ? ? Diet - low sodium heart healthy   Complete by: As directed ?  ? Increase activity slowly   Complete by: As directed ?  ? ?  ? ?Allergies as of 07/09/2021   ?No Known Allergies ?  ? ?  ?Medication List  ?  ? ?TAKE these medications   ? ?sertraline 50 MG tablet ?Commonly known as: Zoloft ?Take 1  tablet (50 mg total) by mouth daily. ?  ? ?  ? ?No Known Allergies ? Follow-up Information   ? ? Morene CrockerPotter, Zachary E, MD Follow up.   ?Specialty: Neurology ?Contact information: ?[address] ?[place] [address] ?[phone] ? ? ?  ?  ? ? Laurier NancyKhan, Shaukat A, MD Follow up.   ?Specialty: Cardiology ?Contact information: ?[address] ?[place] [address] ?[phone] ? ? ?  ?  ? ?  ?  ? ?  ? ? ? ?The results of significant diagnostics from this hospitalization (including  imaging, microbiology, ancillary and laboratory) are listed below for reference.   ? ?Significant Diagnostic Studies: ?DG Chest 2 View ? ?Result Date: 07/08/2021 ?CLINICAL DATA:  Shortness of breath EXAM: CHEST - 2 VIEW COMPARISON:  None. FINDINGS: The heart size and mediastinal contours are within normal limits. Both lungs are clear. The visualized skeletal structures are unremarkable. IMPRESSION: No acute findings. Electronically Signed   By: Charlett NoseKevin  Dover M.D.   On: 07/08/2021 00:35  ? ?CT HEAD WO CONTRAST (5MM) ? ?Result Date: 07/09/2021 ?CLINICAL DATA:  intermittent numbness throughout body EXAM: CT HEAD WITHOUT CONTRAST TECHNIQUE: Contiguous axial images were obtained from the base of the skull through the vertex without intravenous contrast. RADIATION DOSE REDUCTION: This exam was performed according to the departmental dose-optimization program which includes automated exposure control, adjustment of the mA and/or kV according to patient size and/or use of iterative reconstruction technique. COMPARISON:  CT head June 18, 2009. FINDINGS: Brain: No evidence of acute large vascular territory infarction, hemorrhage, hydrocephalus, extra-axial collection or mass lesion/mass effect. Vascular: No hyperdense vessel identified. Skull: No acute fracture. Sinuses/Orbits: Clear sinuses.  No acute orbital findings. Other: No mastoid effusions. IMPRESSION: No evidence of acute intracranial abnormality. Electronically Signed   By: Feliberto HartsFrederick S Jones M.D.   On: 07/09/2021 10:50  ? ?CT Angio Chest PE W/Cm &/Or Wo Cm ? ?Result Date: 07/08/2021 ?CLINICAL DATA:  26 year old male with shortness of breath onset tonight. Diaphoretic. EXAM: CT ANGIOGRAPHY CHEST WITH CONTRAST TECHNIQUE: Multidetector CT imaging of the chest was performed using the standard protocol during bolus administration of intravenous contrast. Multiplanar CT image reconstructions and MIPs were obtained to evaluate the vascular anatomy. RADIATION DOSE REDUCTION:  This exam was performed according to the departmental dose-optimization program which includes automated exposure control, adjustment of the mA and/or kV according to patient size and/or use of iterative reconstruction technique. CONTRAST:  75mL OMNIPAQUE IOHEXOL 350 MG/ML SOLN COMPARISON:  Chest radiographs 0020 hours today. FINDINGS: Cardiovascular: Good contrast bolus timing in the pulmonary arterial tree. Respiratory motion in the lower lobes. No focal filling defect identified in the pulmonary arteries to suggest acute pulmonary embolism. No cardiomegaly or pericardial effusion. Normal visible aorta. No calcified coronary artery atherosclerosis is evident. Mediastinum/Nodes: Negative. Lungs/Pleura: Major airways are patent. Relatively normal lung volumes. Respiratory motion in both lower lobes and at the posterior costophrenic angles, but otherwise both lungs appear clear. No pleural effusion. Upper Abdomen: Negative visible liver, spleen, pancreas, adrenal glands, left kidney, and bowel in the upper abdomen. Musculoskeletal: Negative. Review of the MIP images confirms the above findings. IMPRESSION: 1. Lower lobe respiratory motion artifact. No acute pulmonary embolus identified. 2. Otherwise negative CT appearance of the Chest. Electronically Signed   By: Odessa FlemingH  Hall M.D.   On: 07/08/2021 04:32  ? ?ECHOCARDIOGRAM COMPLETE ? ?Result Date: 07/08/2021 ?   ECHOCARDIOGRAM REPORT   Patient Name:   Allen KellARON D Naramore Date of Exam: 07/08/2021 Medical Rec #:  [id number]    Height:       63.0 in Accession #:    [id number]   Weight:       120.0 lb Date of Birth:  [birth year]    BSA:          1.556 m? Patient Age:    25 years     BP:           123/73 mmHg Patient Gender: M            HR:           93 bpm. Exam Location:  [facility] Procedure: 2D Echo, Cardiac Doppler and Color Doppler Indications:     Chest pain R07.9  History:         Patient has no prior history of Echocardiogram examinations.                  Anxiety.  Sonographer:      Cristela Blue Referring Phys:  [id number] AGBATA Diagnosing Phys: Adrian Blackwater  Sonographer Comments: Suboptimal apical window. IMPRESSIONS  1. Left ventricular ejection fraction, by estimation, is 55 to 60%.

## 2021-07-09 NOTE — TOC CM/SW Note (Signed)
Patient has orders to discharge home today. Chart reviewed. PCP is [facility]. On room air. No wounds. No TOC needs identified. CSW signing off. ? ?Charlynn Court, CSW ?[phone] ? ?

## 2021-07-09 NOTE — Discharge Instructions (Signed)
Please excuse Roberto Clark from work 07/08/21 and 07/09/21. ?

## 2021-10-04 ENCOUNTER — Other Ambulatory Visit: Payer: Self-pay | Admitting: Obstetrics and Gynecology

## 2021-10-21 ENCOUNTER — Ambulatory Visit
Admission: EM | Admit: 2021-10-21 | Discharge: 2021-10-21 | Disposition: A | Discharge status: Home / Self Care | Payer: No Typology Code available for payment source | Attending: Nurse Practitioner | Admitting: Nurse Practitioner

## 2021-10-21 DIAGNOSIS — F419 Anxiety disorder, unspecified: Secondary | ICD-10-CM | POA: Diagnosis not present

## 2021-10-21 DIAGNOSIS — F411 Generalized anxiety disorder: Secondary | ICD-10-CM

## 2021-10-21 MED ORDER — SERTRALINE HCL 50 MG PO TABS: 50.0000 mg | ORAL_TABLET | Freq: Every day | ORAL | 0 refills | Status: DC

## 2021-10-21 NOTE — ED Provider Notes (Signed)
[facility]    CSN: [id number] Arrival date & time: 10/21/21  1652      History   Chief Complaint Chief Complaint  Patient presents with   Medication Refill    HPI Roberto Clark is a 26 y.o. male.   HPI  He is in today requesting a refill. He has GAD and is currently on Sertraline 50 mg. He reports that he tolerates this well. He had to get use to it. He is able to work full time with out difficulty. He is in the process of establishing Primary Care but this may not occur until November. He is requesting several refills. Denies headache, dizziness, visual changes, shortness of breath, dyspnea on exertion, chest pain, nausea, vomiting or any edema.   Past Medical History:  Diagnosis Date   Anxiety    Asthma     Patient Active Problem List   Diagnosis Date Noted   Panic disorder 07/09/2021   GAD (generalized anxiety disorder) 07/09/2021   Elevated troponin 07/09/2021   Numbness 07/09/2021   Elevated blood pressure reading 07/09/2021   Chest pain 07/08/2021    Past Surgical History:  Procedure Laterality Date   TYMPANOSTOMY TUBE PLACEMENT         Home Medications    Prior to Admission medications   Medication Sig Start Date End Date Taking? Authorizing Provider  sertraline (ZOLOFT) 50 MG tablet Take 1 tablet (50 mg total) by mouth daily. 10/21/21 11/20/21 Yes Barbette Merino, NP    Family History History reviewed. No pertinent family history.  Social History Social History   Tobacco Use   Smoking status: Never   Smokeless tobacco: Never  Vaping Use   Vaping Use: Former  Substance Use Topics   Alcohol use: Yes   Drug use: No     Allergies   Patient has no known allergies.   Review of Systems Review of Systems   Physical Exam Triage Vital Signs ED Triage Vitals  Enc Vitals Group     BP 10/21/21 1705 (!) 147/70     Pulse Rate 10/21/21 1705 82     Resp 10/21/21 1705 18     Temp 10/21/21 1705 99.1 F (37.3 C)     Temp Source  10/21/21 1705 Oral     SpO2 10/21/21 1705 98 %     Weight 10/21/21 1703 120 lb (54.4 kg)     Height 10/21/21 1703 5\' 3"  (1.6 m)     Head Circumference --      Peak Flow --      Pain Score 10/21/21 1703 0     Pain Loc --      Pain Edu? --      Excl. in GC? --    No data found.  Updated Vital Signs BP (!) 147/70 (BP Location: Left Arm)   Pulse 82   Temp 99.1 F (37.3 C) (Oral)   Resp 18   Ht 5\' 3"  (1.6 m)   Wt 120 lb (54.4 kg)   SpO2 98%   BMI 21.26 kg/m   Visual Acuity Right Eye Distance:   Left Eye Distance:   Bilateral Distance:    Right Eye Near:   Left Eye Near:    Bilateral Near:     Physical Exam Constitutional:      General: He is not in acute distress.    Appearance: He is normal weight.  HENT:     Head: Normocephalic.  Eyes:     Comments:  glasses  Cardiovascular:     Rate and Rhythm: Normal rate and regular rhythm.     Pulses: Normal pulses.  Pulmonary:     Effort: Pulmonary effort is normal.  Musculoskeletal:        General: Normal range of motion.     Cervical back: Normal range of motion.  Skin:    General: Skin is warm and dry.  Neurological:     General: No focal deficit present.     Mental Status: He is alert and oriented to person, place, and time.  Psychiatric:        Mood and Affect: Mood normal.        Behavior: Behavior normal.      UC Treatments / Results  Labs (all labs ordered are listed, but only abnormal results are displayed) Labs Reviewed - No data to display  EKG   Radiology No results found.  Procedures Procedures (including critical care time)  Medications Ordered in UC Medications - No data to display  Initial Impression / Assessment and Plan / UC Course  I have reviewed the triage vital signs and the nursing notes.  Pertinent labs & imaging results that were available during my care of the patient were reviewed by me and considered in my medical decision making (see chart for details).     Anxiety  Final Clinical Impressions(s) / UC Diagnoses   Final diagnoses:  Anxiety  GAD (generalized anxiety disorder)     Discharge Instructions      Refilled Sertraline 50 mg qd #30      ED Prescriptions     Medication Sig Dispense Auth. Provider   sertraline (ZOLOFT) 50 MG tablet Take 1 tablet (50 mg total) by mouth daily. 30 tablet Barbette Merino, NP      PDMP not reviewed this encounter.   Thad Ranger [place], [place] 10/21/21 [phone]

## 2021-10-21 NOTE — ED Triage Notes (Signed)
Pt asks for a medication refill of Sertraline HCL 50 MG.  Pt asks for a 2 month refill

## 2021-10-21 NOTE — Discharge Instructions (Addendum)
Refilled Sertraline 50 mg qd #30

## 2021-11-17 ENCOUNTER — Other Ambulatory Visit: Payer: Self-pay | Admitting: Nurse Practitioner

## 2022-05-31 ENCOUNTER — Ambulatory Visit: Payer: No Typology Code available for payment source | Admitting: Internal Medicine

## 2022-06-13 ENCOUNTER — Ambulatory Visit: Payer: No Typology Code available for payment source | Admitting: Physician Assistant

## 2022-11-29 ENCOUNTER — Emergency Department
Admission: EM | Admit: 2022-11-29 | Discharge: 2022-11-29 | Disposition: A | Discharge status: Home / Self Care | Payer: No Typology Code available for payment source | Attending: Emergency Medicine | Admitting: Emergency Medicine

## 2022-11-29 DIAGNOSIS — F419 Anxiety disorder, unspecified: Secondary | ICD-10-CM | POA: Diagnosis present

## 2022-11-29 DIAGNOSIS — R079 Chest pain, unspecified: Secondary | ICD-10-CM | POA: Diagnosis not present

## 2022-11-29 LAB — CBC
HCT: 38.3 % — ABNORMAL LOW (ref 39.0–52.0)
Hemoglobin: 13.1 g/dL (ref 13.0–17.0)
MCH: 28.4 pg (ref 26.0–34.0)
MCHC: 34.2 g/dL (ref 30.0–36.0)
MCV: 83.1 fL (ref 80.0–100.0)
Platelets: 277 10*3/uL (ref 150–400)
RBC: 4.61 MIL/uL (ref 4.22–5.81)
RDW: 12.1 % (ref 11.5–15.5)
WBC: 6.6 10*3/uL (ref 4.0–10.5)
nRBC: 0 % (ref 0.0–0.2)

## 2022-11-29 LAB — BASIC METABOLIC PANEL
Anion gap: 11 (ref 5–15)
BUN: 11 mg/dL (ref 6–20)
CO2: 24 mmol/L (ref 22–32)
Calcium: 8.2 mg/dL — ABNORMAL LOW (ref 8.9–10.3)
Chloride: 101 mmol/L (ref 98–111)
Creatinine, Ser: 0.86 mg/dL (ref 0.61–1.24)
GFR, Estimated: >60 mL/min (ref >60)
Glucose, Bld: 96 mg/dL (ref 70–99)
Potassium: 3.2 mmol/L — ABNORMAL LOW (ref 3.5–5.1)
Sodium: 136 mmol/L (ref 135–145)

## 2022-11-29 LAB — TROPONIN I (HIGH SENSITIVITY): Troponin I (High Sensitivity): 3 ng/L (ref <18)

## 2022-11-29 MED ORDER — LORAZEPAM 1 MG PO TABS
1.0000 mg | ORAL_TABLET | Freq: Once | ORAL | Status: AC
  Administered 2022-11-29: 1 mg via ORAL
  Filled 2022-11-29: qty 1

## 2022-11-29 MED ORDER — HYDROXYZINE HCL 25 MG PO TABS: 25.0000 mg | ORAL_TABLET | Freq: Three times a day (TID) | ORAL | 0 refills | Status: DC | PRN

## 2022-11-29 NOTE — ED Provider Notes (Signed)
[facility] Provider Note    Event Date/Time   First MD Initiated Contact with Patient 11/29/22 1908     (approximate)  History   Chief Complaint: Anxiety (Patient walked to fire station. EMS called and states patient was anxious and hyperventilating, Sinus tach on the monitor, HR 160s and body posture rigid. EMS states patient became more visibly anxious so gave 7.5 mg of Versed. )  HPI  CALIJAH VANDERHOEF is a 27 y.o. male with a past medical history anxiety who presents to the emergency department for anxiety symptoms.  According to the patient around 8:00 this morning he developed chest pain that lasted several minutes.  Patient states he believes this flared up his anxiety because ever since he has been feeling very anxious.  EMS states upon their arrival patient had walked to the fire department was tachycardic around 160 bpm was tachypneic hyperventilating.  Patient was given Versed and brought to the emergency department for further evaluation.  Patient has been able to calm down somewhat but continues to be very anxious per patient.  States he feels very "antsy."  Physical Exam   Triage Vital Signs: ED Triage Vitals  Encounter Vitals Group     BP 11/29/22 1849 135/70     Systolic BP Percentile --      Diastolic BP Percentile --      Pulse Rate 11/29/22 1849 96     Resp 11/29/22 1849 16     Temp 11/29/22 1852 98.1 F (36.7 C)     Temp Source 11/29/22 1852 Oral     SpO2 11/29/22 1849 100 %     Weight --      Height --      Head Circumference --      Peak Flow --      Pain Score --      Pain Loc --      Pain Education --      Exclude from Growth Chart --     Most recent vital signs: Vitals:   11/29/22 1849 11/29/22 1852  BP: 135/70   Pulse: 96   Resp: 16   Temp:  98.1 F (36.7 C)  SpO2: 100%     General: Awake, mildly anxious looking. CV:  Good peripheral perfusion.  Regular rate and rhythm around 80 bpm Resp:  Normal effort.  Equal breath  sounds bilaterally.  Abd:  No distention.  Soft, nontender.  No rebound or guarding.  ED Results / Procedures / Treatments   EKG  EKG viewed and interpreted by myself shows normal sinus rhythm 89 bpm with a narrow QRS, normal axis, normal intervals, nonspecific ST changes.  MEDICATIONS ORDERED IN ED: Medications  LORazepam (ATIVAN) tablet 1 mg (has no administration in time range)     IMPRESSION / MDM / ASSESSMENT AND PLAN / ED COURSE  I reviewed the triage vital signs and the nursing notes.  Patient's presentation is most consistent with acute presentation with potential threat to life or bodily function.  Patient presents to the emergency department for chest pain this morning followed by anxiety symptoms throughout the day today.  Patient's workup shows a reassuring CBC, reassuring chemistry and a negative troponin.  EKG reassuring as well.  No chest pain.  Vital signs reassuring.  Patient is feeling somewhat better less anxious.  States he does not have an outpatient doctor at this time I would like to speak to a psychiatrist.  I have placed a psychiatry TTS  consult for the patient.  He remains here voluntarily.  Patient's Reassuring Normal CBC Normal Chemistry Negative Troponin Reassuring EKG.  Patient States He No Longer Wishes to Wait for Psychiatry.  States He Is Feeling Much Better and Is Ready to Go Home.  Discussed Return Precautions As Well As Outpatient Follow-Up.  Will Discharge on Hydroxyzine.  Patient Agreeable to Plan of Care.  FINAL CLINICAL IMPRESSION(S) / ED DIAGNOSES   Anxiety   Note:  This document was prepared using Dragon voice recognition software and may include unintentional dictation errors.   Minna Antis, MD 11/29/22 2100

## 2022-11-29 NOTE — Discharge Instructions (Addendum)
Please call the number provided for [facility] to arrange a follow-up appointment soon as possible.  Please take your medication if needed for anxiety but only as prescribed.  Do not drink alcohol or drive while taking this medication.  Return to the emergency department for any symptom personally concerning to yourself.

## 2023-01-26 ENCOUNTER — Encounter: Payer: Self-pay | Admitting: Pediatrics

## 2023-01-26 ENCOUNTER — Ambulatory Visit: Payer: No Typology Code available for payment source | Admitting: Pediatrics

## 2023-01-26 VITALS — BP 146/96 | HR 80 | Ht 67.0 in | Wt 128.6 lb

## 2023-01-26 DIAGNOSIS — F410 Panic disorder [episodic paroxysmal anxiety] without agoraphobia: Secondary | ICD-10-CM | POA: Diagnosis not present

## 2023-01-26 DIAGNOSIS — R259 Unspecified abnormal involuntary movements: Secondary | ICD-10-CM

## 2023-01-26 DIAGNOSIS — R030 Elevated blood-pressure reading, without diagnosis of hypertension: Secondary | ICD-10-CM | POA: Diagnosis not present

## 2023-01-26 DIAGNOSIS — Z7689 Persons encountering health services in other specified circumstances: Secondary | ICD-10-CM | POA: Diagnosis not present

## 2023-01-26 DIAGNOSIS — Z1330 Encounter for screening examination for mental health and behavioral disorders, unspecified: Secondary | ICD-10-CM | POA: Diagnosis not present

## 2023-01-26 DIAGNOSIS — H6123 Impacted cerumen, bilateral: Secondary | ICD-10-CM

## 2023-01-26 MED ORDER — HYDROXYZINE HCL 10 MG PO TABS
10.0000 mg | ORAL_TABLET | Freq: Three times a day (TID) | ORAL | 0 refills | Status: DC | PRN
Start: 2023-01-26 — End: 2023-02-22

## 2023-01-26 MED ORDER — ESCITALOPRAM OXALATE 10 MG PO TABS
10.0000 mg | ORAL_TABLET | Freq: Every day | ORAL | 1 refills | Status: DC
Start: 2023-01-26 — End: 2023-02-22

## 2023-01-26 NOTE — Progress Notes (Deleted)
   Office Visit  Ht 5\' 7"  (1.702 m)   Wt 128 lb 9.6 oz (58.3 kg)   BMI 20.14 kg/m    Subjective:    Patient ID: Roberto Clark, male    DOB: [birth year], 27 y.o.   MRN: [id number]  HPI: Roberto Clark is a 27 y.o. male  Chief Complaint  Patient presents with   Anxiety    Patient was recently hospitalized for panic attacks. Patient says when he has the attacks, he will have chest tightness, numbness in the entire and hands and arms lock up. Patient has not had an panic attack since his hospitalized. Patient says he thinks it is stress related and he is a IT sales professional currently.     Relevant past medical, surgical, family and social history reviewed and updated as indicated. Interim medical history since our last visit reviewed. Allergies and medications reviewed and updated.  ROS per HPI unless specifically indicated above     Objective:    Ht 5\' 7"  (1.702 m)   Wt 128 lb 9.6 oz (58.3 kg)   BMI 20.14 kg/m   Wt Readings from Last 3 Encounters:  01/26/23 128 lb 9.6 oz (58.3 kg)  10/21/21 120 lb (54.4 kg)  07/08/21 126 lb 6.4 oz (57.3 kg)     Physical Exam       No data to display              No data to display             Assessment & Plan:  Assessment & Plan   There are no diagnoses linked to this encounter.    Follow up plan: No follow-ups on file.  Clydie Dillen Howell Pringle, MD

## 2023-01-26 NOTE — Progress Notes (Signed)
Establish Care Note  BP (!) 146/96   Pulse 80   Ht 5\' 7"  (1.702 m)   Wt 128 lb 9.6 oz (58.3 kg)   SpO2 98%   BMI 20.14 kg/m    Subjective:    Patient ID: Roberto Clark, male    DOB: Dec 28, 1995, 27 y.o.   MRN: [id number]  HPI: Roberto Clark is a 27 y.o. male  Chief Complaint  Patient presents with   Anxiety    Patient was recently hospitalized for panic attacks. Patient says when he has the attacks, he will have chest tightness, numbness in the entire and hands and arms lock up. Patient has not had an panic attack since his hospitalized. Patient says he thinks it is stress related and he is a IT sales professional currently. Patient has been treated for Anxiety in the past and stopped all medications back in 2022.    Hypertension    Patient says he would like to discuss with provider about Hypertension. Patient mother says she and patient's father has history of Hypertension. Patient says he does have times with blurred vision.     Establishing care, the following was discussed today:  Discussed the use of AI scribe software for clinical note transcription with the patient, who gave verbal consent to proceed.  History of Present Illness   The patient, with a history of anxiety, presents with recurrent episodes of severe chest pain, which have led to multiple emergency room visits. The patient describes these episodes as sudden and intense, often accompanied by a sensation of being unable to breathe. These symptoms have been previously attributed to anxiety; however, the patient also reports episodes of hand tremors and freezing in place until calm is restored.  The onset of these anxiety symptoms reportedly began after obtaining a driver's license at the age of 31. The patient recalls a significant episode while working at [employer], where he felt unable to breathe and required emergency medical services. The patient also reports a history of head trauma from a childhood incident, which  resulted in a minor concussion.  The patient has previously been on Zoloft and hydroxyzine for anxiety management but has been off these medications for an unspecified duration. The patient reports some benefit from Zoloft but does not recall the effectiveness of hydroxyzine.  In addition to anxiety symptoms, the patient also reports auditory processing difficulties, which have been present since childhood and have impacted social interactions and understanding of complex language. The patient denies any hearing impairment but acknowledges occasional difficulty with comprehension.  The patient also reports a recent increase in blood pressure readings, although he has not been regularly monitoring this at home. The patient has made lifestyle modifications, such as reducing coffee intake, in an attempt to manage this.  The patient expresses interest in restarting medication for anxiety management and is open to exploring therapeutic options. He also expresses a desire for an emotional support animal to assist during anxiety attacks.     #HM Immunizations:  due for covid, flu, declines. Will address tdap again at follow up.  Relevant past medical, surgical, family and social history reviewed and updated as indicated. Interim medical history since our last visit reviewed. Allergies and medications reviewed and updated.  ROS per HPI unless specifically indicated above     Objective:    BP (!) 146/96   Pulse 80   Ht 5\' 7"  (1.702 m)   Wt 128 lb 9.6 oz (58.3 kg)   SpO2 98%  BMI 20.14 kg/m   Wt Readings from Last 3 Encounters:  01/26/23 128 lb 9.6 oz (58.3 kg)  10/21/21 120 lb (54.4 kg)  07/08/21 126 lb 6.4 oz (57.3 kg)     Physical Exam Constitutional:      Appearance: Normal appearance.  HENT:     Head: Normocephalic and atraumatic.  Eyes:     Extraocular Movements: Extraocular movements intact.     Pupils: Pupils are equal, round, and reactive to light.  Cardiovascular:     Rate  and Rhythm: Normal rate and regular rhythm.     Pulses: Normal pulses.     Heart sounds: Normal heart sounds.  Pulmonary:     Effort: Pulmonary effort is normal.     Breath sounds: Normal breath sounds.  Musculoskeletal:        General: Normal range of motion.     Cervical back: Normal range of motion.  Skin:    General: Skin is warm and dry.     Capillary Refill: Capillary refill takes less than 2 seconds.  Neurological:     General: No focal deficit present.     Mental Status: He is alert. Mental status is at baseline.     Cranial Nerves: Cranial nerves 2-12 are intact.     Sensory: Sensation is intact.     Motor: Motor function is intact.     Coordination: Coordination is intact. Romberg sign negative. Coordination normal. Finger-Nose-Finger Test and Heel to [facility] Test normal. Rapid alternating movements normal.     Gait: Gait is intact.  Psychiatric:        Mood and Affect: Mood normal.        Behavior: Behavior normal.         01/26/2023    1:53 PM  Depression screen PHQ 2/9  Decreased Interest 0  Down, Depressed, Hopeless 0  PHQ - 2 Score 0  Altered sleeping 0  Tired, decreased energy 0  Change in appetite 0  Feeling bad or failure about yourself  0  Trouble concentrating 0  Moving slowly or fidgety/restless 0  Suicidal thoughts 0  PHQ-9 Score 0  Difficult doing work/chores Not difficult at all        01/26/2023    1:53 PM  GAD 7 : Generalized Anxiety Score  Nervous, Anxious, on Edge 1  Control/stop worrying 0  Worry too much - different things 0  Trouble relaxing 0  Restless 0  Easily annoyed or irritable 0  Afraid - awful might happen 0  Total GAD 7 Score 1  Anxiety Difficulty Not difficult at all       Assessment & Plan:  Assessment & Plan   Encounter to establish care Reviewed patient record including history, medications, problem list. HM updated as able. Will bring records and will fill HM gaps as needed at follow up visit.  Panic  disorder Assessment & Plan: Frequent panic attacks with severe symptoms including chest pain, shortness of breath, and freezing. Previous treatment with Zoloft and hydroxyzine. Has had multiple ED visits for this. Unclear if comborbid GAD or if primary panic disorder. Will start therapy as below. -Start Lexapro 5mg  for one week, then increase to 10mg  daily. -Use hydroxyzine as needed for panic symptoms, up to three times daily. -Consider therapy for anxiety and panic disorder, provided resources for finding a therapist.  Orders: -     hydrOXYzine HCl; Take 1 tablet (10 mg total) by mouth 3 (three) times daily as needed.  Dispense: 30  tablet; Refill: 0 -     Escitalopram Oxalate; Take 1 tablet (10 mg total) by mouth daily. Take 1/2 for 7 days then full tab, take this daily.  Dispense: 30 tablet; Refill: 1  Elevated blood pressure reading Assessment & Plan: Blood pressure elevated at 146/96 during visit. 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 -Return for follow-up in 3-4 weeks to review blood pressure readings and discuss possible medication if necessary.  Bilateral impacted cerumen Complaints of heavy feeling in both ears, possibly due to earwax. -Use over-the-counter Debrox drops to soften and remove earwax.   Abnormal involuntary movements Episodes of drooling and fixed position during panic attacks and prior episodes. Describes what sound like post ictal states though difficult to discern from history.  -Refer to neurology for further evaluation, including EEG. -     Ambulatory referral to Neurology  Encounter for behavioral health screening As part of their intake evaluation, the patient was screened for depression, anxiety.  PHQ9 SCORE 0, GAD7 SCORE 1. Screening results negative for tested conditions. See plan under problem/diagnosis above.  Follow up plan: Return in about 3 weeks (around 02/16/2023).  Gunnar Hereford Howell Pringle, MD

## 2023-01-26 NOTE — Patient Instructions (Addendum)
For ear wax: use debro  For anxiety: - Start lexapro 10mg , please take half a tab for 1 week then the full tab daily. Can take at night or during the day, but just make sure you take it every day. - as needed: hydroxyzine 10mg  for panic attacks. Can take up to 3 a day.  Good to meet you! Welcome to [facility]!  As your primary care doctor, I look forward to working with you to help you reach your health goals.  Please be aware of a couple of logistical items: - If you message me on mychart, it may take me 1-2 business days to get back to you. This is for non-urgent messaging.  - If you require urgent clinical attention, please call the clinic or present to urgent care/emergency room - If you have labs, I typically will send a message about them in 1-2 business days. - I am not here on Mondays, otherwise will be available from Tuesday-Friday during 8a-5pm.   Measure your blood pressure at home! Home Blood Pressure Monitoring is an important part of managing blood pressure and thought to be more accurate than the measures we get in the clinic.  Here's some tips on how to take your blood pressure accurately at home and some highly rated monitors. Most insurances (except for Medicaid) won't pay for monitors, so unfortunately they are an out-of-pocket expense for most people.  Taking an accurate blood pressure measurement: To get an accurate blood pressure reading, empty your bladder first, then rest in a seated position for at least 5 minutes. Ideally, no caffeine or tobacco use in last 30 minutes. Use an arm cuff (not wrist - see recommendations below) seated in a chair with a back next to a table or object that is high enough that you can rest your arm so the blood pressure cuff is at the level of your heart and you can lean back comfortably. Keep both feet on the floor and don't talk while the machine is working. Checking at different times of the day can be helpful to get an idea of  your average numbers. Your goal blood pressure should be <130/80. BP Monitor Ratings: Here are some top rated Blood Pressure Monitors as tested by Consumer Reports (accessed January, 2024): (*BB) Best buy = highly rated with lower price Rating Brand/Model Estimated Cost  86 Omron Platinum BP5450 $79  85 Omron Silver BP5250 (*BB)  $53  84 Omron 10 Series BP7450 $92  83 Omron Evolv BP7000 $110  81 [employer] Medical [id number] $52  80 Omron 3 Series BP7100 $50  78 iHealth KN550BT $40  76 Up & Up ([employer]) Automatic Upper Arm 48-554 $30  Note: all units listed above are arm cuffs which are more accurate than wrist cuffs. For more information (and the source of the below info-graphic on how to get set up to get an accurate reading) - check out: SuperiorMarketers.be

## 2023-01-31 ENCOUNTER — Encounter: Payer: Self-pay | Admitting: Pediatrics

## 2023-01-31 NOTE — Assessment & Plan Note (Signed)
Frequent panic attacks with severe symptoms including chest pain, shortness of breath, and freezing. Previous treatment with Zoloft and hydroxyzine. Has had multiple ED visits for this. Unclear if comborbid GAD or if primary panic disorder. Will start therapy as below. -Start Lexapro 5mg  for one week, then increase to 10mg  daily. -Use hydroxyzine as needed for panic symptoms, up to three times daily. -Consider therapy for anxiety and panic disorder, provided resources for finding a therapist.

## 2023-01-31 NOTE — Assessment & Plan Note (Signed)
Blood pressure elevated at 146/96 during visit. 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 -Return for follow-up in 3-4 weeks to review blood pressure readings and discuss possible medication if necessary.

## 2023-02-21 ENCOUNTER — Ambulatory Visit: Payer: No Typology Code available for payment source | Admitting: Pediatrics

## 2023-02-21 ENCOUNTER — Telehealth: Payer: Self-pay | Admitting: Pediatrics

## 2023-02-21 NOTE — Telephone Encounter (Signed)
Prescription Request  02/21/2023  LOV: 01/26/2023  What is the name of the medication or equipment? escitalopram 10 MG tablet                      hydrOXYzine 10 MG tablet   Have you contacted your pharmacy to request a refill? No   Which pharmacy would you like this sent to?   [id number] - [place], [place] - [address] [place] [address] Phone: [phone] Fax: [phone]   Patient notified that their request is being sent to the clinical staff for review and that they should receive a response within 2 business days.   Please advise at Mobile [phone] (mobile)   Patient had an appointment scheduled with Dr. Evelene Croon this morning. Appointment was rescheduled to 03-02-23 due to Evelene Croon being out sick today.. Patient will be out of meds on 02-25-23. He is asking for refills until his appt on 03-02-23.

## 2023-02-22 ENCOUNTER — Other Ambulatory Visit: Payer: Self-pay | Admitting: Pediatrics

## 2023-02-22 DIAGNOSIS — F410 Panic disorder [episodic paroxysmal anxiety] without agoraphobia: Secondary | ICD-10-CM

## 2023-02-22 MED ORDER — HYDROXYZINE HCL 10 MG PO TABS
10.0000 mg | ORAL_TABLET | Freq: Three times a day (TID) | ORAL | 3 refills | Status: DC | PRN
Start: 2023-02-22 — End: 2023-02-22

## 2023-02-22 MED ORDER — ESCITALOPRAM OXALATE 10 MG PO TABS
10.0000 mg | ORAL_TABLET | Freq: Every day | ORAL | 3 refills | Status: DC
Start: 2023-02-22 — End: 2023-03-02

## 2023-02-22 MED ORDER — ESCITALOPRAM OXALATE 10 MG PO TABS
10.0000 mg | ORAL_TABLET | Freq: Every day | ORAL | 3 refills | Status: DC
Start: 2023-02-22 — End: 2023-02-22

## 2023-02-22 MED ORDER — HYDROXYZINE HCL 10 MG PO TABS
10.0000 mg | ORAL_TABLET | Freq: Three times a day (TID) | ORAL | 3 refills | Status: DC | PRN
Start: 2023-02-22 — End: 2023-07-13

## 2023-02-22 NOTE — Progress Notes (Signed)
Refills sent

## 2023-03-02 ENCOUNTER — Ambulatory Visit: Payer: No Typology Code available for payment source | Admitting: Pediatrics

## 2023-03-02 ENCOUNTER — Encounter: Payer: Self-pay | Admitting: Pediatrics

## 2023-03-02 VITALS — BP 132/80 | HR 71 | Temp 98.3°F | Resp 14 | Wt 132.4 lb

## 2023-03-02 DIAGNOSIS — Z5971 Insufficient health insurance coverage: Secondary | ICD-10-CM | POA: Diagnosis not present

## 2023-03-02 DIAGNOSIS — Z1330 Encounter for screening examination for mental health and behavioral disorders, unspecified: Secondary | ICD-10-CM | POA: Diagnosis not present

## 2023-03-02 DIAGNOSIS — F410 Panic disorder [episodic paroxysmal anxiety] without agoraphobia: Secondary | ICD-10-CM

## 2023-03-02 DIAGNOSIS — F411 Generalized anxiety disorder: Secondary | ICD-10-CM

## 2023-03-02 MED ORDER — ESCITALOPRAM OXALATE 10 MG PO TABS: 10.0000 mg | ORAL_TABLET | Freq: Every day | ORAL | 3 refills | Status: DC

## 2023-03-02 NOTE — Patient Instructions (Addendum)
Continue taking lexapro 10mg , hydroxyzine 10mg 

## 2023-03-02 NOTE — Progress Notes (Signed)
Office Visit  BP 132/80 (BP Location: Left Arm, Cuff Size: Normal)   Pulse 71   Temp 98.3 F (36.8 C) (Oral)   Resp 14   Wt 132 lb 6.4 oz (60.1 kg)   SpO2 100%   BMI 20.74 kg/m    Subjective:    Patient ID: Roberto Clark, male    DOB: [birth year], 27 y.o.   MRN: [id number]  HPI: Roberto Clark is a 27 y.o. male  Chief Complaint  Patient presents with   Medication Refill    Anxiety, no recent attacks.    #anxiety follow up Tolerating lexapro well, has not had recurrent symptoms Has not needed hydroxyzine He is concerned about cost of medications No SI/HI  Relevant past medical, surgical, family and social history reviewed and updated as indicated. Interim medical history since our last visit reviewed. Allergies and medications reviewed and updated.  ROS per HPI unless specifically indicated above     Objective:    BP 132/80 (BP Location: Left Arm, Cuff Size: Normal)   Pulse 71   Temp 98.3 F (36.8 C) (Oral)   Resp 14   Wt 132 lb 6.4 oz (60.1 kg)   SpO2 100%   BMI 20.74 kg/m   Wt Readings from Last 3 Encounters:  03/02/23 132 lb 6.4 oz (60.1 kg)  01/26/23 128 lb 9.6 oz (58.3 kg)  10/21/21 120 lb (54.4 kg)     Physical Exam Constitutional:      Appearance: Normal appearance.  Pulmonary:     Effort: Pulmonary effort is normal.  Musculoskeletal:        General: Normal range of motion.  Skin:    Comments: Normal skin color  Neurological:     General: No focal deficit present.     Mental Status: He is alert. Mental status is at baseline.  Psychiatric:        Mood and Affect: Mood normal.        Behavior: Behavior normal.        Thought Content: Thought content normal.         03/02/2023    2:01 PM 01/26/2023    1:53 PM  Depression screen PHQ 2/9  Decreased Interest 0 0  Down, Depressed, Hopeless 0 0  PHQ - 2 Score 0 0  Altered sleeping 0 0  Tired, decreased energy 0 0  Change in appetite 0 0  Feeling bad or failure about yourself  0 0   Trouble concentrating 0 0  Moving slowly or fidgety/restless 0 0  Suicidal thoughts 0 0  PHQ-9 Score 0 0  Difficult doing work/chores Not difficult at all Not difficult at all       03/02/2023    2:01 PM 01/26/2023    1:53 PM  GAD 7 : Generalized Anxiety Score  Nervous, Anxious, on Edge 0 1  Control/stop worrying 0 0  Worry too much - different things 0 0  Trouble relaxing 0 0  Restless 0 0  Easily annoyed or irritable 0 0  Afraid - awful might happen 0 0  Total GAD 7 Score 0 1  Anxiety Difficulty Not difficult at all Not difficult at all       Assessment & Plan:  Assessment & Plan   Panic disorder GAD (generalized anxiety disorder) Assessment & Plan: Doing well. Continue lexapro at current dose. Provided pharmacy coupon for 90 day supply. No acute safety concerns at this time.  Orders: -     Escitalopram Oxalate;  Take 1 tablet (10 mg total) by mouth daily.  Dispense: 90 tablet; Refill: 3  Insurance coverage problems Needs assistance with better insurance plan, referral sent. -     AMB Referral [facility]  Encounter for behavioral health screening As part of their intake evaluation, the patient was screened for depression, anxiety.  PHQ9 SCORE 0, GAD7 SCORE 0. Screening results negative for tested conditions. See plan under problem/diagnosis above.  Follow up plan: Return in about 3 months (around 05/31/2023) for Physical, Mood.  Roberto Morrical Howell Pringle, MD

## 2023-03-03 ENCOUNTER — Telehealth: Payer: Self-pay | Admitting: *Deleted

## 2023-03-03 ENCOUNTER — Encounter: Payer: Self-pay | Admitting: Pediatrics

## 2023-03-03 NOTE — Assessment & Plan Note (Signed)
Doing well. Continue lexapro at current dose. Provided pharmacy coupon for 90 day supply. No acute safety concerns at this time.

## 2023-03-03 NOTE — Progress Notes (Unsigned)
Complex Care Management  Outreach Note  03/03/2023 Name: KEIDEN BERNASCONI MRN: [id number] DOB: Feb 12, 1996   Complex Care Management Outreach Attempts: An unsuccessful telephone outreach was attempted today to offer the patient information about available complex care management services.  Follow Up Plan:  Additional outreach attempts will be made to offer the patient complex care management information and services.   Encounter Outcome:  No Answer  Burman Nieves, CCMA Care Coordination Care Guide Direct Dial: [phone]

## 2023-03-06 NOTE — Progress Notes (Unsigned)
Complex Care Management Note Care Guide Note  03/06/2023 Name: SONNY ANDREOTTI MRN: [id number] DOB: 04/11/1995   Complex Care Management Outreach Attempts: A second unsuccessful outreach was attempted today to offer the patient with information about available complex care management services.  Follow Up Plan:  Additional outreach attempts will be made to offer the patient complex care management information and services.   Encounter Outcome:  No Answer  Burman Nieves, CCMA Care Coordination Care Guide Direct Dial: [phone]

## 2023-03-07 NOTE — Progress Notes (Signed)
Complex Care Management Note Care Guide Note  03/07/2023 Name: Roberto Clark MRN: [id number] DOB: 13-Sep-1995   Complex Care Management Outreach Attempts: A third unsuccessful outreach was attempted today to offer the patient with information about available complex care management services.  Follow Up Plan:  No further outreach attempts will be made at this time. We have been unable to contact the patient to offer or enroll patient in complex care management services.  Encounter Outcome:  No Answer  Burman Nieves, CCMA Care Coordination Care Guide Direct Dial: [phone]

## 2023-04-27 IMAGING — CT CT HEAD W/O CM
4 series · 17 of 47 positions shown, 19 images · non-contrast
Comparison: CT head June 18, 2009.

CLINICAL DATA: intermittent numbness throughout body



[Series 2: head wo · axial · 0.39mm/px · z∈[-155,-40]mm · 7 of 31 slices shown, 9 images]
[im 4/31  brain]
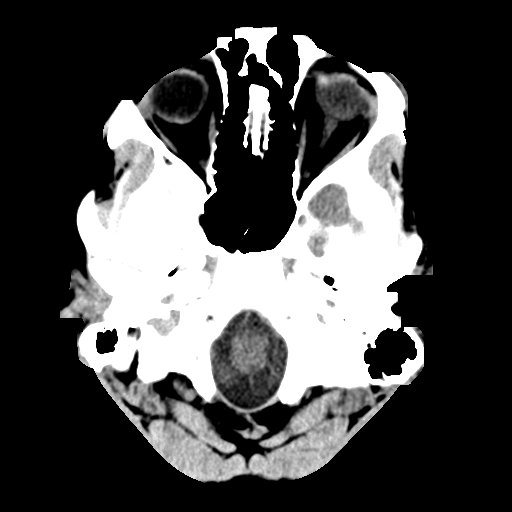
[im 4/31  bone]
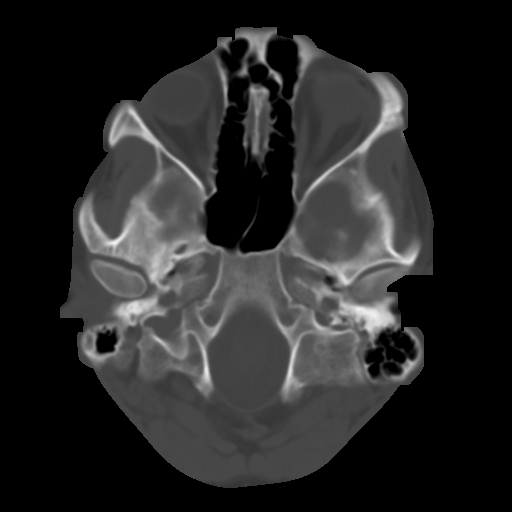
[im 8/31  brain]
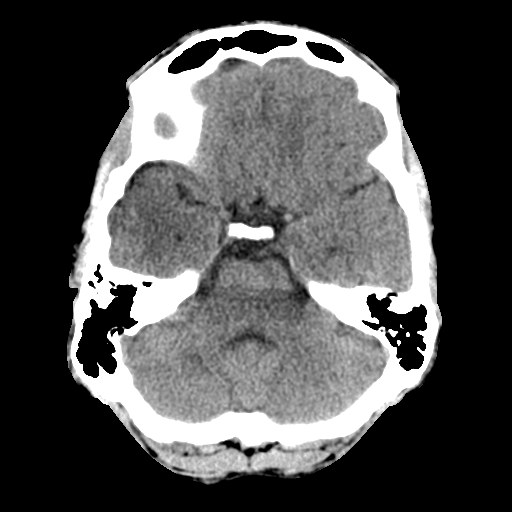
[im 12/31  brain]
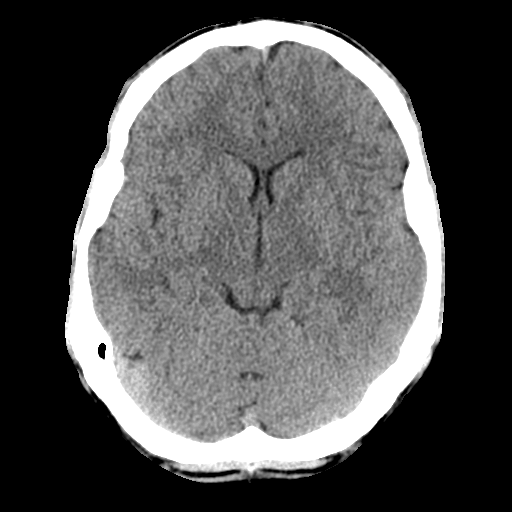
[im 16/31  brain]
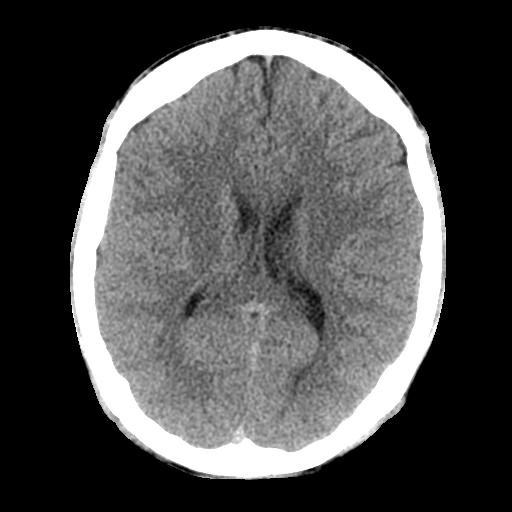
[im 19/31  brain]
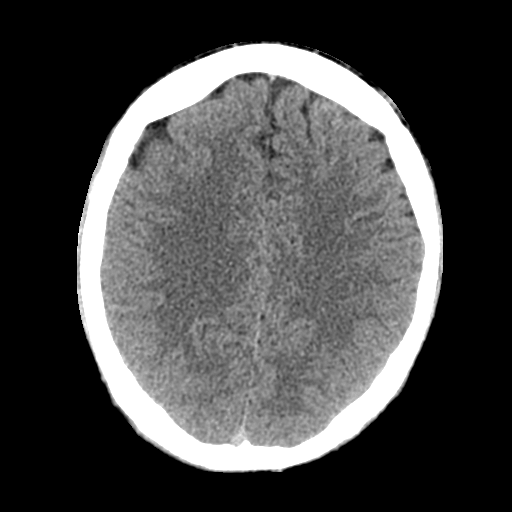
[im 19/31  bone]
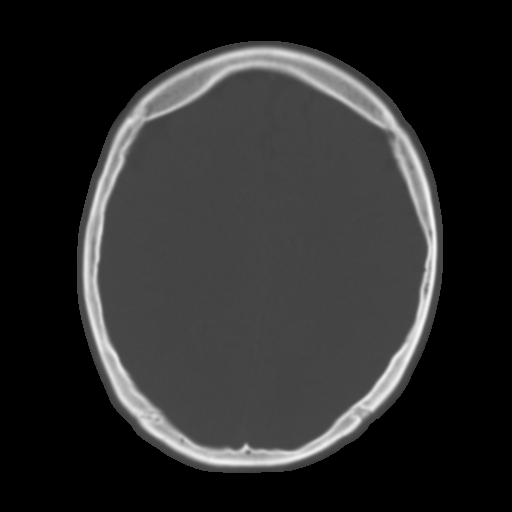
[im 23/31  brain]
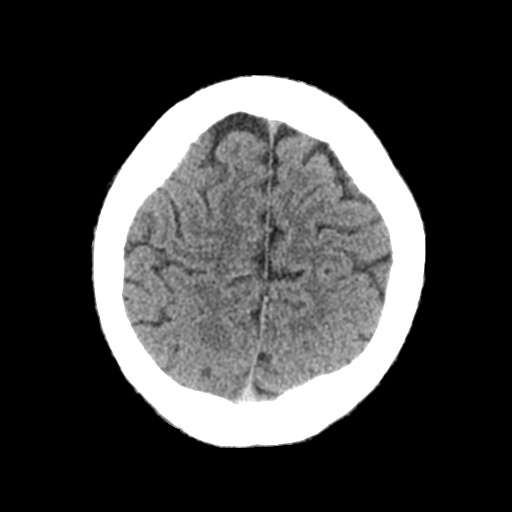
[im 27/31  brain]
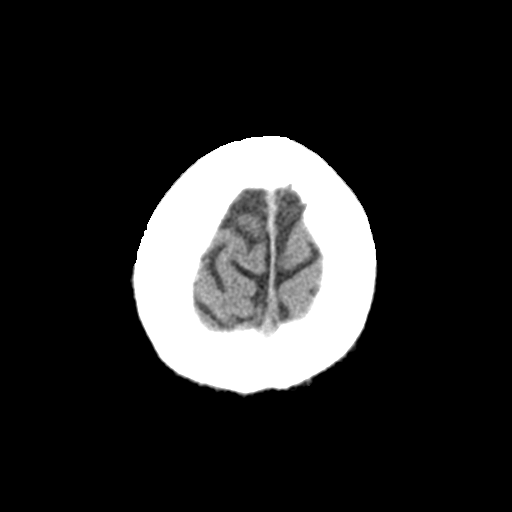

[Series 3: head bone · axial · 0.39mm/px · z∈[-156,-102]mm · 4 of 77 slices shown]
[im 8/77  bone]
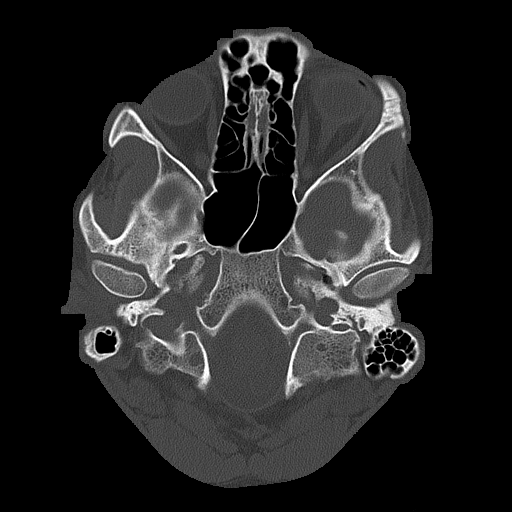
[im 16/77  bone]
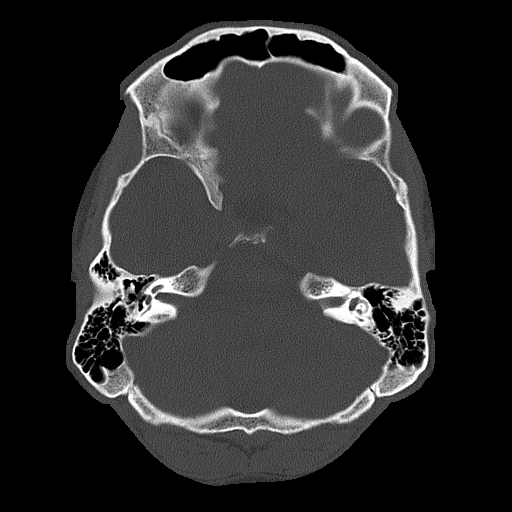
[im 23/77  bone]
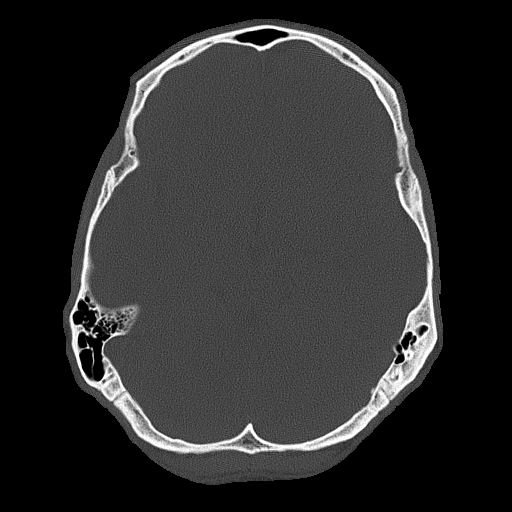
[im 35/77  bone]
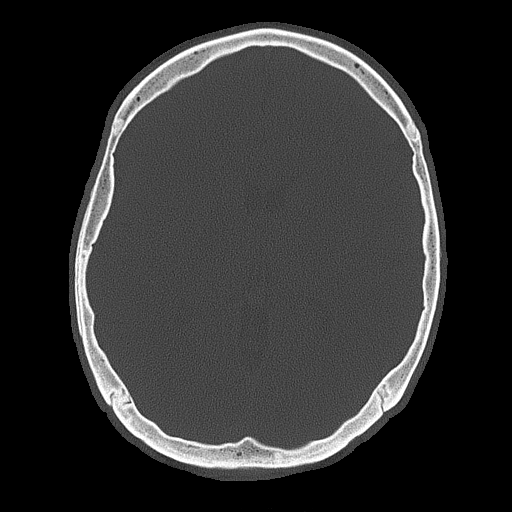

[Series 4: coronal soft tissue · coronal · 0.32mm/px · 3 of 66 slices shown]
[im 22/66  brain]
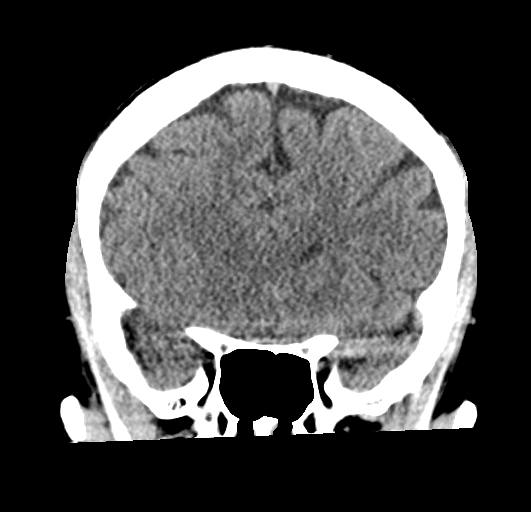
[im 29/66  brain]
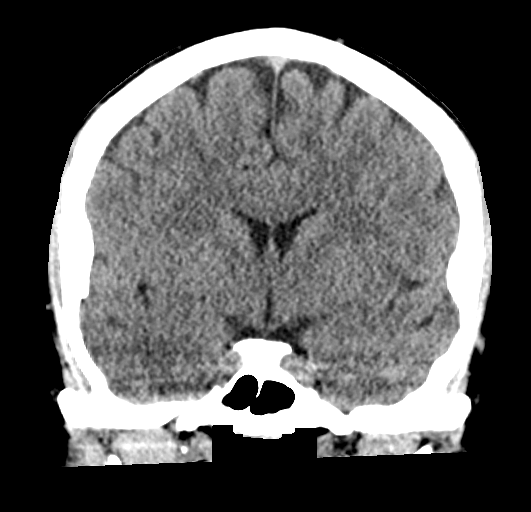
[im 37/66  brain]
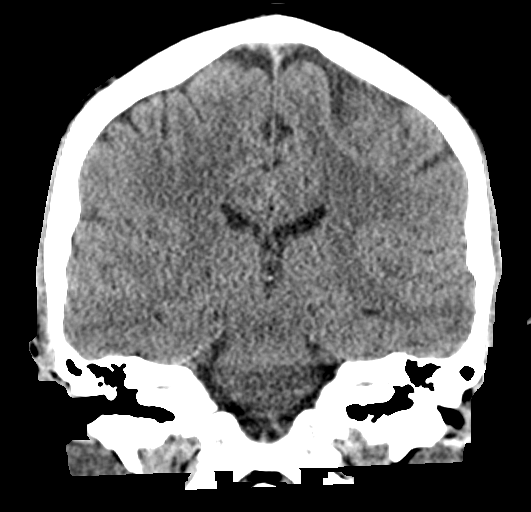

[Series 5: sagittal soft tissue · sagittal · 0.33mm/px · 3 of 56 slices shown]
[im 19/56  brain]
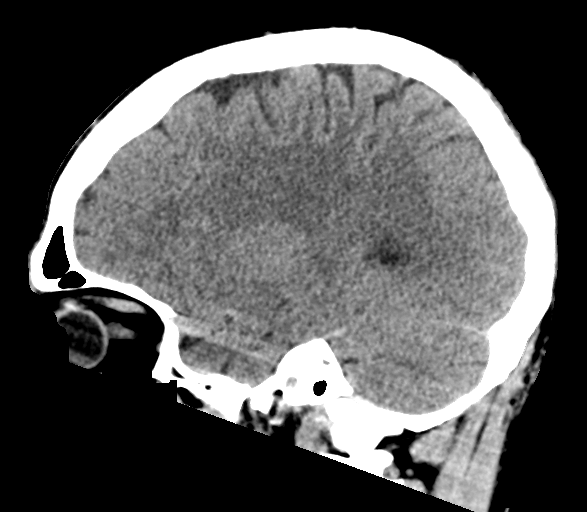
[im 28/56  brain]
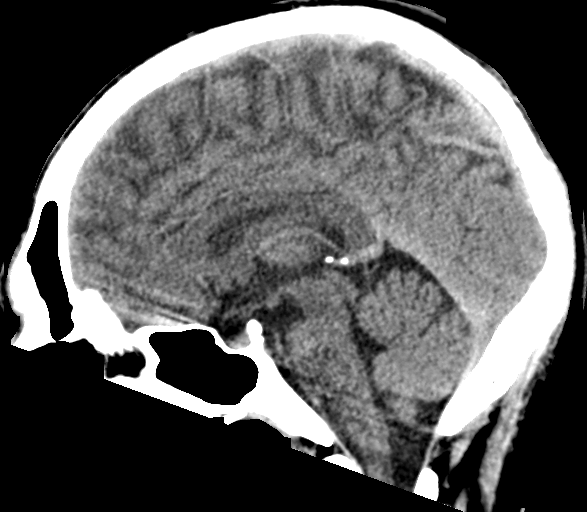
[im 37/56  brain]
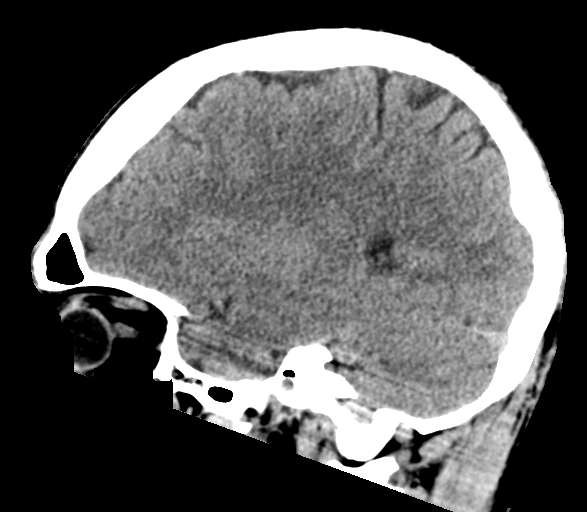

[17 of 47 positions shown; findings below may reference images not displayed]

FINDINGS: Brain: No evidence of acute large vascular territory infarction,
hemorrhage, hydrocephalus, extra-axial collection or mass
lesion/mass effect.

Vascular: No hyperdense vessel identified.

Skull: No acute fracture.

Sinuses/Orbits: Clear sinuses.  No acute orbital findings.

Other: No mastoid effusions.
IMPRESSION: No evidence of acute intracranial abnormality.

## 2023-05-31 ENCOUNTER — Encounter: Payer: Self-pay | Admitting: Pediatrics

## 2023-07-13 ENCOUNTER — Other Ambulatory Visit: Payer: Self-pay | Admitting: Pediatrics

## 2023-07-13 DIAGNOSIS — F410 Panic disorder [episodic paroxysmal anxiety] without agoraphobia: Secondary | ICD-10-CM

## 2023-07-13 NOTE — Telephone Encounter (Signed)
 Copied from CRM [id number]. Topic: Clinical - Medication Refill >> Jul 13, 2023 11:56 AM Elle L wrote: Most Recent Primary Care Visit:  Provider: Hadassah Letters  Department: [facility]  Visit Type: OFFICE VISIT  Date: 03/02/2023  Medication: hydrOXYzine  (ATARAX ) 10 MG tablet   Has the patient contacted their pharmacy? Yes  Is this the correct pharmacy for this prescription? Yes  This is the patient's preferred pharmacy:   [id number] - [place], [place] - [address] AT [facility] [address] [place] [address] Phone: [phone] Fax: [phone]  Has the prescription been filled recently? No  Is the patient out of the medication? No  Has the patient been seen for an appointment in the last year OR does the patient have an upcoming appointment? Yes  Can we respond through MyChart? Yes  Agent: Please be advised that Rx refills may take up to 3 business days. We ask that you follow-up with your pharmacy.

## 2023-07-13 NOTE — Telephone Encounter (Signed)
 Copied from CRM [id number]. Topic: Clinical - Medication Refill >> Jul 13, 2023 12:00 PM Elle L wrote: Communication Most Recent Primary Care Visit:  Provider: Hadassah Letters   Department: [facility]   Visit Type: OFFICE VISIT   Date: 03/02/2023    Medication: hydrOXYzine  (ATARAX ) 10 MG tablet    Has the patient contacted their pharmacy? Yes    Is this the correct pharmacy for this prescription? Yes    This is the patient's preferred pharmacy:    [id number] - [place], [place] - [address] AT [facility] [address] [place]  [address]  [place] [address]  Phone: [phone] Fax: [phone]    Has the prescription been filled recently? No    Is the patient out of the medication? No    Has the patient been seen for an appointment in the last year OR does the patient have an upcoming appointment? Yes    Can we respond through MyChart? Yes    Agent: Please be advised that Rx refills may take up to 3 business days. We ask that you follow-up with your pharmacy.

## 2023-07-17 NOTE — Telephone Encounter (Signed)
 Requested medication (s) are due for refill today: yes  Requested medication (s) are on the active medication list: yes  Last refill:  02/22/23  Future visit scheduled: no  Notes to clinic:  Unable to refill per protocol, courtesy refill already given, routing for provider approval.      Requested Prescriptions  Pending Prescriptions Disp Refills   hydrOXYzine  (ATARAX ) 10 MG tablet 30 tablet 3    Sig: Take 1 tablet (10 mg total) by mouth 3 (three) times daily as needed.     Ear, Nose, and Throat:  Antihistamines 2 Failed - 07/17/2023  8:59 AM      Failed - Valid encounter within last 12 months    Recent Outpatient Visits   None            Passed - Cr in normal range and within 360 days    Creatinine, Ser  Date Value Ref Range Status  11/29/2022 0.86 0.61 - 1.24 mg/dL Final

## 2023-07-18 MED ORDER — HYDROXYZINE HCL 10 MG PO TABS
10.0000 mg | ORAL_TABLET | Freq: Three times a day (TID) | ORAL | 3 refills | Status: DC | PRN
Start: 2023-07-18 — End: 2023-09-11

## 2023-08-22 ENCOUNTER — Emergency Department
Admission: EM | Admit: 2023-08-22 | Discharge: 2023-08-22 | Disposition: A | Discharge status: Home / Self Care | Attending: Emergency Medicine | Admitting: Emergency Medicine

## 2023-08-22 ENCOUNTER — Emergency Department

## 2023-08-22 ENCOUNTER — Other Ambulatory Visit: Payer: Self-pay

## 2023-08-22 DIAGNOSIS — R0789 Other chest pain: Secondary | ICD-10-CM | POA: Diagnosis not present

## 2023-08-22 DIAGNOSIS — R200 Anesthesia of skin: Secondary | ICD-10-CM | POA: Insufficient documentation

## 2023-08-22 DIAGNOSIS — R112 Nausea with vomiting, unspecified: Secondary | ICD-10-CM | POA: Diagnosis not present

## 2023-08-22 DIAGNOSIS — D72829 Elevated white blood cell count, unspecified: Secondary | ICD-10-CM | POA: Insufficient documentation

## 2023-08-22 DIAGNOSIS — F410 Panic disorder [episodic paroxysmal anxiety] without agoraphobia: Secondary | ICD-10-CM | POA: Diagnosis present

## 2023-08-22 DIAGNOSIS — R202 Paresthesia of skin: Secondary | ICD-10-CM | POA: Insufficient documentation

## 2023-08-22 DIAGNOSIS — J45909 Unspecified asthma, uncomplicated: Secondary | ICD-10-CM | POA: Insufficient documentation

## 2023-08-22 LAB — TROPONIN I (HIGH SENSITIVITY)
Troponin I (High Sensitivity): 4 ng/L (ref <18)
Troponin I (High Sensitivity): 4 ng/L (ref <18)

## 2023-08-22 LAB — CBC WITH DIFFERENTIAL/PLATELET
Abs Immature Granulocytes: 0.06 10*3/uL (ref 0.00–0.07)
Basophils Absolute: 0 10*3/uL (ref 0.0–0.1)
Basophils Relative: 0 %
Eosinophils Absolute: 0 10*3/uL (ref 0.0–0.5)
Eosinophils Relative: 0 %
HCT: 43.5 % (ref 39.0–52.0)
Hemoglobin: 15.2 g/dL (ref 13.0–17.0)
Immature Granulocytes: 0 %
Lymphocytes Relative: 13 %
Lymphs Abs: 1.7 10*3/uL (ref 0.7–4.0)
MCH: 29.5 pg (ref 26.0–34.0)
MCHC: 34.9 g/dL (ref 30.0–36.0)
MCV: 84.3 fL (ref 80.0–100.0)
Monocytes Absolute: 0.8 10*3/uL (ref 0.1–1.0)
Monocytes Relative: 6 %
Neutro Abs: 10.8 10*3/uL — ABNORMAL HIGH (ref 1.7–7.7)
Neutrophils Relative %: 81 %
Platelets: 328 10*3/uL (ref 150–400)
RBC: 5.16 MIL/uL (ref 4.22–5.81)
RDW: 11.7 % (ref 11.5–15.5)
WBC: 13.4 10*3/uL — ABNORMAL HIGH (ref 4.0–10.5)
nRBC: 0 % (ref 0.0–0.2)

## 2023-08-22 LAB — BASIC METABOLIC PANEL WITH GFR
Anion gap: 12 (ref 5–15)
BUN: 12 mg/dL (ref 6–20)
CO2: 24 mmol/L (ref 22–32)
Calcium: 9.1 mg/dL (ref 8.9–10.3)
Chloride: 101 mmol/L (ref 98–111)
Creatinine, Ser: 0.93 mg/dL (ref 0.61–1.24)
GFR, Estimated: >60 mL/min (ref >60)
Glucose, Bld: 95 mg/dL (ref 70–99)
Potassium: 3.7 mmol/L (ref 3.5–5.1)
Sodium: 137 mmol/L (ref 135–145)

## 2023-08-22 NOTE — Discharge Instructions (Addendum)
 Your blood work, chest xray and EKG were reassuring today. I believe you had a panic attack.   Please begin checking your blood pressure at home a few times a week. You can get an automatic blood pressure cuff to check it. Keep track of these numbers and bring this information to your primary care provider. I suspect your blood pressure is elevated today because of the stress, but if it remains elevated you may need to start blood pressure medication.

## 2023-08-22 NOTE — ED Triage Notes (Signed)
 Patient to ED via [facility] for possible panic attack. 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 Patient does have Hx of anxiety. Patient states on the way back to the fire station he drank lots of water but still did not feel great and began having hand tremors. With EMS, patient had several episodes of nausea and vomiting. Patient states he has not had anything to eat today.   EMS Vitals: 126 HR 173/104 100% room air  98.0 CBG 103
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
## 2023-08-23 ENCOUNTER — Telehealth: Payer: Self-pay | Admitting: Pediatrics

## 2023-08-23 NOTE — Telephone Encounter (Signed)
 Called and notified patient that medication was being refilled for him.

## 2023-08-23 NOTE — Telephone Encounter (Signed)
 RN called pharmacy due to 3 refills remaining. Pharm confirmed and are refilling medication request. Medication will be available for pick up on 6/13.

## 2023-08-23 NOTE — Telephone Encounter (Signed)
 Copied from CRM [id number]. Topic: Clinical - Medication Refill >> Aug 23, 2023 10:20 AM Sasha H wrote: Medication: hydrOXYzine  (ATARAX ) 10 MG tablet  Has the patient contacted their pharmacy? Yes (Agent: If no, request that the patient contact the pharmacy for the refill. If patient does not wish to contact the pharmacy document the reason why and proceed with request.) (Agent: If yes, when and what did the pharmacy advise?)  This is the patient's preferred pharmacy:   [id number] - [place], [place] - [address] [place] [address] Phone: [phone] Fax: [phone]   Is this the correct pharmacy for this prescription? Yes If no, delete pharmacy and type the correct one.   Has the prescription been filled recently? Yes  Is the patient out of the medication? Yes  Has the patient been seen for an appointment in the last year OR does the patient have an upcoming appointment? Yes  Can we respond through MyChart? Yes  Agent: Please be advised that Rx refills may take up to 3 business days. We ask that you follow-up with your pharmacy.

## 2023-09-11 ENCOUNTER — Encounter: Payer: Self-pay | Admitting: Pediatrics

## 2023-09-11 ENCOUNTER — Ambulatory Visit (INDEPENDENT_AMBULATORY_CARE_PROVIDER_SITE_OTHER): Admitting: Pediatrics

## 2023-09-11 VITALS — BP 144/92 | HR 98 | Temp 98.4°F | Resp 15 | Ht 64.02 in | Wt 135.6 lb

## 2023-09-11 DIAGNOSIS — F410 Panic disorder [episodic paroxysmal anxiety] without agoraphobia: Secondary | ICD-10-CM

## 2023-09-11 DIAGNOSIS — Z1330 Encounter for screening examination for mental health and behavioral disorders, unspecified: Secondary | ICD-10-CM | POA: Diagnosis not present

## 2023-09-11 DIAGNOSIS — R030 Elevated blood-pressure reading, without diagnosis of hypertension: Secondary | ICD-10-CM | POA: Diagnosis not present

## 2023-09-11 DIAGNOSIS — J309 Allergic rhinitis, unspecified: Secondary | ICD-10-CM | POA: Diagnosis not present

## 2023-09-11 MED ORDER — FLUTICASONE PROPIONATE 50 MCG/ACT NA SUSP
2.0000 | Freq: Every day | NASAL | 6 refills | Status: AC
Start: 2023-09-11 — End: ?

## 2023-09-11 MED ORDER — HYDROXYZINE HCL 25 MG PO TABS: 25.0000 mg | ORAL_TABLET | Freq: Three times a day (TID) | ORAL | 2 refills | Status: AC | PRN

## 2023-09-11 MED ORDER — ESCITALOPRAM OXALATE 10 MG PO TABS
10.0000 mg | ORAL_TABLET | Freq: Every day | ORAL | 3 refills | Status: AC
Start: 2023-09-11 — End: 2024-09-10

## 2023-09-11 NOTE — Patient Instructions (Addendum)
 I am also sending hydroxizine 25mg  as needed. Feel free to break this in half if you find it too sedating. Max 3 tabs a day.  Lexapro  10mg  - take 1/2 tab for the first 5 days  I'll send a blood pressure cuff

## 2023-09-12 ENCOUNTER — Encounter: Payer: Self-pay | Admitting: Pediatrics

## 2023-09-12 DIAGNOSIS — J309 Allergic rhinitis, unspecified: Secondary | ICD-10-CM | POA: Insufficient documentation

## 2023-09-12 NOTE — Assessment & Plan Note (Signed)
 Persistent nasal congestion and occasional epistaxis likely due to nasal picking and inflammation. Post-cold inflammation suspected. - Prescribe Flonase nasal spray. - Consider referral to ENT if symptoms persist.
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
## 2023-09-12 NOTE — Assessment & Plan Note (Signed)
 Experiences panic attacks with elevated blood pressure due to anxiety. Previously on Lexapro , prefers to restart it over alternatives. - Restart Lexapro  at half tab for 5-7 days, then increase to 10 mg daily. - Increase hydroxyzine  to 25 mg as needed, aim to reduce use as Lexapro  takes effect. - Follow up in 4 weeks to assess medication efficacy.

## 2023-09-12 NOTE — Assessment & Plan Note (Signed)
 Blood pressure elevated due to anxiety. Antihypertensive medication not recommended. Home monitoring advised. - Order digital blood pressure cuff for home monitoring. - Instruct to sit for 5 minutes before checking blood pressure.

## 2023-10-10 ENCOUNTER — Ambulatory Visit: Admitting: Pediatrics
